# Patient Record
Sex: Female | Born: 1945 | Race: White | Hispanic: No | Marital: Married | Smoking: Never smoker
Health system: Southern US, Community
[De-identification: ages and names within clinical notes are randomized; demographics above are authoritative.]

## PROBLEM LIST (undated history)

## (undated) DIAGNOSIS — E079 Disorder of thyroid, unspecified: Secondary | ICD-10-CM

## (undated) DIAGNOSIS — C801 Malignant (primary) neoplasm, unspecified: Secondary | ICD-10-CM

## (undated) DIAGNOSIS — I10 Essential (primary) hypertension: Secondary | ICD-10-CM

## (undated) DIAGNOSIS — R748 Abnormal levels of other serum enzymes: Secondary | ICD-10-CM

## (undated) HISTORY — PX: ABDOMINAL HYSTERECTOMY: SHX81

## (undated) HISTORY — PX: APPENDECTOMY: SHX54

## (undated) HISTORY — PX: BREAST REDUCTION SURGERY: SHX8

## (undated) HISTORY — PX: TONSILLECTOMY: SUR1361

## (undated) HISTORY — PX: EYE SURGERY: SHX253

---

## 2013-11-05 ENCOUNTER — Other Ambulatory Visit: Payer: Self-pay

## 2013-11-05 ENCOUNTER — Emergency Department (HOSPITAL_COMMUNITY): Payer: Medicare Other

## 2013-11-05 ENCOUNTER — Encounter (HOSPITAL_COMMUNITY): Payer: Self-pay | Admitting: Emergency Medicine

## 2013-11-05 ENCOUNTER — Inpatient Hospital Stay (HOSPITAL_COMMUNITY)
Admission: EM | Admit: 2013-11-05 | Discharge: 2013-11-09 | DRG: 843 | Disposition: A | Discharge status: Home / Self Care | Payer: Medicare Other | Attending: Internal Medicine | Admitting: Internal Medicine

## 2013-11-05 DIAGNOSIS — N032 Chronic nephritic syndrome with diffuse membranous glomerulonephritis: Secondary | ICD-10-CM | POA: Diagnosis present

## 2013-11-05 DIAGNOSIS — I129 Hypertensive chronic kidney disease with stage 1 through stage 4 chronic kidney disease, or unspecified chronic kidney disease: Secondary | ICD-10-CM | POA: Diagnosis present

## 2013-11-05 DIAGNOSIS — N390 Urinary tract infection, site not specified: Secondary | ICD-10-CM | POA: Diagnosis present

## 2013-11-05 DIAGNOSIS — E039 Hypothyroidism, unspecified: Secondary | ICD-10-CM | POA: Diagnosis present

## 2013-11-05 DIAGNOSIS — E8809 Other disorders of plasma-protein metabolism, not elsewhere classified: Principal | ICD-10-CM | POA: Diagnosis present

## 2013-11-05 DIAGNOSIS — J90 Pleural effusion, not elsewhere classified: Secondary | ICD-10-CM | POA: Diagnosis present

## 2013-11-05 DIAGNOSIS — E8779 Other fluid overload: Secondary | ICD-10-CM | POA: Diagnosis present

## 2013-11-05 DIAGNOSIS — R0600 Dyspnea, unspecified: Secondary | ICD-10-CM

## 2013-11-05 DIAGNOSIS — R601 Generalized edema: Secondary | ICD-10-CM

## 2013-11-05 DIAGNOSIS — E669 Obesity, unspecified: Secondary | ICD-10-CM | POA: Diagnosis present

## 2013-11-05 DIAGNOSIS — J189 Pneumonia, unspecified organism: Secondary | ICD-10-CM | POA: Diagnosis present

## 2013-11-05 DIAGNOSIS — M7989 Other specified soft tissue disorders: Secondary | ICD-10-CM

## 2013-11-05 DIAGNOSIS — D649 Anemia, unspecified: Secondary | ICD-10-CM | POA: Diagnosis present

## 2013-11-05 DIAGNOSIS — R809 Proteinuria, unspecified: Secondary | ICD-10-CM | POA: Diagnosis present

## 2013-11-05 DIAGNOSIS — J449 Chronic obstructive pulmonary disease, unspecified: Secondary | ICD-10-CM | POA: Diagnosis present

## 2013-11-05 DIAGNOSIS — N182 Chronic kidney disease, stage 2 (mild): Secondary | ICD-10-CM | POA: Diagnosis present

## 2013-11-05 DIAGNOSIS — R7989 Other specified abnormal findings of blood chemistry: Secondary | ICD-10-CM

## 2013-11-05 DIAGNOSIS — J4489 Other specified chronic obstructive pulmonary disease: Secondary | ICD-10-CM | POA: Diagnosis present

## 2013-11-05 HISTORY — DX: Abnormal levels of other serum enzymes: R74.8

## 2013-11-05 HISTORY — DX: Disorder of thyroid, unspecified: E07.9

## 2013-11-05 LAB — CBC WITH DIFFERENTIAL/PLATELET
BASOS ABS: 0 10*3/uL (ref 0.0–0.1)
BASOS PCT: 0 % (ref 0–1)
EOS ABS: 0.4 10*3/uL (ref 0.0–0.7)
EOS PCT: 5 % (ref 0–5)
HEMATOCRIT: 32.3 % — AB (ref 36.0–46.0)
HEMOGLOBIN: 10.8 g/dL — AB (ref 12.0–15.0)
Lymphocytes Relative: 21 % (ref 12–46)
Lymphs Abs: 1.4 10*3/uL (ref 0.7–4.0)
MCH: 30.7 pg (ref 26.0–34.0)
MCHC: 33.4 g/dL (ref 30.0–36.0)
MCV: 91.8 fL (ref 78.0–100.0)
MONO ABS: 0.6 10*3/uL (ref 0.1–1.0)
MONOS PCT: 9 % (ref 3–12)
Neutro Abs: 4.3 10*3/uL (ref 1.7–7.7)
Neutrophils Relative %: 65 % (ref 43–77)
Platelets: 157 10*3/uL (ref 150–400)
RBC: 3.52 MIL/uL — ABNORMAL LOW (ref 3.87–5.11)
RDW: 14.5 % (ref 11.5–15.5)
WBC: 6.6 10*3/uL (ref 4.0–10.5)

## 2013-11-05 LAB — COMPREHENSIVE METABOLIC PANEL
ALBUMIN: 2.6 g/dL — AB (ref 3.5–5.2)
ALT: 15 U/L (ref 0–35)
AST: 18 U/L (ref 0–37)
Alkaline Phosphatase: 125 U/L — ABNORMAL HIGH (ref 39–117)
Anion gap: 8 (ref 5–15)
BILIRUBIN TOTAL: 0.2 mg/dL — AB (ref 0.3–1.2)
BUN: 16 mg/dL (ref 6–23)
CALCIUM: 8.5 mg/dL (ref 8.4–10.5)
CO2: 27 mEq/L (ref 19–32)
CREATININE: 0.96 mg/dL (ref 0.50–1.10)
Chloride: 108 mEq/L (ref 96–112)
GFR calc Af Amer: 70 mL/min — ABNORMAL LOW (ref >90)
GFR calc non Af Amer: 60 mL/min — ABNORMAL LOW (ref >90)
Glucose, Bld: 117 mg/dL — ABNORMAL HIGH (ref 70–99)
Potassium: 3.9 mEq/L (ref 3.7–5.3)
Sodium: 143 mEq/L (ref 137–147)
Total Protein: 6 g/dL (ref 6.0–8.3)

## 2013-11-05 LAB — URINALYSIS, ROUTINE W REFLEX MICROSCOPIC
Bilirubin Urine: NEGATIVE
Glucose, UA: NEGATIVE mg/dL
KETONES UR: NEGATIVE mg/dL
Leukocytes, UA: NEGATIVE
NITRITE: NEGATIVE
PH: 6 (ref 5.0–8.0)
Specific Gravity, Urine: >1.03 — ABNORMAL HIGH (ref 1.005–1.030)
Urobilinogen, UA: 0.2 mg/dL (ref 0.0–1.0)

## 2013-11-05 LAB — D-DIMER, QUANTITATIVE (NOT AT ARMC): D-Dimer, Quant: 1.21 ug/mL-FEU — ABNORMAL HIGH (ref 0.00–0.48)

## 2013-11-05 LAB — PRO B NATRIURETIC PEPTIDE: Pro B Natriuretic peptide (BNP): 2853 pg/mL — ABNORMAL HIGH (ref 0–125)

## 2013-11-05 LAB — URINE MICROSCOPIC-ADD ON

## 2013-11-05 MED ORDER — SODIUM CHLORIDE 0.9 % IV BOLUS (SEPSIS)
1000.0000 mL | Freq: Once | INTRAVENOUS | Status: AC
  Administered 2013-11-05: 1000 mL via INTRAVENOUS

## 2013-11-05 MED ORDER — ENOXAPARIN SODIUM 100 MG/ML ~~LOC~~ SOLN
1.0000 mg/kg | Freq: Once | SUBCUTANEOUS | Status: AC
Start: 2013-11-05 — End: 2013-11-05
  Administered 2013-11-05: 100 mg via SUBCUTANEOUS
  Filled 2013-11-05: qty 1

## 2013-11-05 MED ORDER — IOHEXOL 350 MG/ML SOLN
100.0000 mL | Freq: Once | INTRAVENOUS | Status: AC | PRN
  Administered 2013-11-05: 100 mL via INTRAVENOUS

## 2013-11-05 NOTE — ED Provider Notes (Signed)
CSN: [id number]     Arrival date & time 11/05/13  1819 History   First MD Initiated Contact with Patient 11/05/13 1844    This chart was scribed for Carmin Muskrat, MD by Terressa Koyanagi, ED Scribe. This patient was seen in room APA06/APA06 and the patient's care was started at 6:51 PM.  Chief Complaint  Patient presents with  . Leg Swelling   The history is provided by the patient and the spouse. No language interpreter was used.   HPI Comments: Caroline Terry is a 68 y.o. female who presents to the Emergency Department complaining of leg swelling to the left leg from the left knee to the foot onset yesterday. Pt also complains of associated pain and warmth in said area. Pt notes that she is in the midst of a road trip via car; and she traveled from [place] to [place] yesterday.   Pt also reports that she was treated at [facility] yesterday for pneumonia and Dx with PNA and started on antibiotics. Pt reports a CT was done of the chest and blood test at [facility].   Pt notes she has had a cough, however, she denies chest pain and SOB. Pt and her husband report she is otherwise healthy.   Pt reports taking the following meds regularly: Levofloxacin 500 Mg; Carvedilol 3.125 Mg; Lisinopril; Levothyroxin 125 Mg.    Past Medical History  Diagnosis Date  . Thyroid disease     hypothyroidism  . Cardiac enzymes elevated    Past Surgical History  Procedure Laterality Date  . Breast reduction surgery    . Eye surgery    . Tonsillectomy     History reviewed. No pertinent family history. History  Substance Use Topics  . Smoking status: Never Smoker   . Smokeless tobacco: Not on file  . Alcohol Use: No   OB History   Grav Para Term Preterm Abortions TAB SAB Ect Mult Living                 Review of Systems  Constitutional: Negative for fever.       Per HPI, otherwise negative  HENT:       Per HPI, otherwise negative  Respiratory: Positive for cough. Negative for shortness of  breath.        Per HPI, otherwise negative  Cardiovascular: Positive for leg swelling (left leg ). Negative for chest pain.       Per HPI, otherwise negative  Gastrointestinal: Negative for vomiting.  Endocrine:       Negative aside from HPI  Genitourinary:       Neg aside from HPI   Musculoskeletal:       Left leg tenderness Per HPI, otherwise negative  Skin: Negative.   Neurological: Negative for syncope.    Allergies  Review of patient's allergies indicates no known allergies.  Home Medications   Prior to Admission medications   Not on File   Triage Notes: BP 165/53  Pulse 58  Temp(Src) 98.3 F (36.8 C) (Oral)  Resp 18  Ht 5\' 10"  (1.778 m)  Wt 219 lb (99.338 kg)  BMI 31.42 kg/m2  SpO2 99% Physical Exam  Nursing note and vitals reviewed. Constitutional: She is oriented to person, place, and time. She appears well-developed and well-nourished. No distress.  HENT:  Head: Normocephalic and atraumatic.  Eyes: Conjunctivae and EOM are normal.  Cardiovascular: Normal rate and regular rhythm.   Pulmonary/Chest: Effort normal and breath sounds normal. No stridor. No respiratory distress.  Abdominal: She exhibits no distension. There is no tenderness.  Musculoskeletal: Normal range of motion. She exhibits edema (left calf ).  Neurological: She is alert and oriented to person, place, and time. No cranial nerve deficit.  Skin: Skin is warm and dry.  Psychiatric: She has a normal mood and affect.    ED Course  Procedures (including critical care time) 6:58 PM-Discussed treatment plan which includes labs and imaging with pt at bedside and pt agreed to plan.  7:59PM: Discussed evidence of blood clots and pneumonia with pt and pt's spouse. Discussed treatment plan which includes CT of chest and hospital admittance with pt and pt's husband at bedside. Patient's husband and pt verbalizes understanding and agrees with treatment plan.  Labs Review Labs Reviewed  CBC WITH  DIFFERENTIAL - Abnormal; Notable for the following:    RBC 3.52 (*)    Hemoglobin 10.8 (*)    HCT 32.3 (*)    All other components within normal limits  COMPREHENSIVE METABOLIC PANEL - Abnormal; Notable for the following:    Glucose, Bld 117 (*)    Albumin 2.6 (*)    Alkaline Phosphatase 125 (*)    Total Bilirubin 0.2 (*)    GFR calc non Af Amer 60 (*)    GFR calc Af Amer 70 (*)    All other components within normal limits  URINALYSIS, ROUTINE W REFLEX MICROSCOPIC - Abnormal; Notable for the following:    APPearance HAZY (*)    Specific Gravity, Urine >1.030 (*)    Hgb urine dipstick MODERATE (*)    Protein, ur >300 (*)    All other components within normal limits  D-DIMER, QUANTITATIVE - Abnormal; Notable for the following:    D-Dimer, Quant 1.21 (*)    All other components within normal limits  PRO B NATRIURETIC PEPTIDE - Abnormal; Notable for the following:    Pro B Natriuretic peptide (BNP) 2853.0 (*)    All other components within normal limits  URINE MICROSCOPIC-ADD ON - Abnormal; Notable for the following:    Bacteria, UA MANY (*)    All other components within normal limits    Imaging Review Dg Chest 2 View  11/05/2013   CLINICAL DATA:  Newly diagnosed pneumonia.  EXAM: CHEST  2 VIEW  COMPARISON:  None.  FINDINGS: Cardiomegaly with small to moderate left pleural effusion. Associated left lower lobe opacity, likely atelectasis. No frank interstitial edema. No pneumothorax.  IMPRESSION: Cardiomegaly with small to moderate left pleural effusion.  Associated left lower lobe opacity, likely atelectasis.   Electronically Signed   By: Julian Hy M.D.   On: 11/05/2013 19:49   Ct Angio Chest Pe W/cm &/or Wo Cm  11/05/2013   CLINICAL DATA:  Left leg swelling, newly diagnosed pneumonia, elevated D-dimer  EXAM: CT ANGIOGRAPHY CHEST WITH CONTRAST  TECHNIQUE: Multidetector CT imaging of the chest was performed using the standard protocol during bolus administration of intravenous  contrast. Multiplanar CT image reconstructions and MIPs were obtained to evaluate the vascular anatomy.  CONTRAST:  169mL OMNIPAQUE IOHEXOL 350 MG/ML SOLN  COMPARISON:  Chest radiograph dated 11/05/2013  FINDINGS: No evidence of pulmonary embolism.  Evaluation of lung parenchyma is constrained by respiratory motion. Mild patchy ground-glass opacity in the lateral right upper lobe is favored to reflect mild interstitial edema (series 11/image 17). Additional mild interstitial edema with scattered interlobular septal thickening. Mild lower lobe compressive atelectasis. Moderate bilateral pleural effusions. No pneumothorax.  Cardiomegaly.  No pericardial effusion.  Visualized thyroid is mildly  enlarged/heterogeneous but grossly unremarkable.  Mildly prominent mediastinal lymph nodes which are likely reactive.  Visualized upper abdomen is grossly unremarkable.  Mild degenerative changes of the thoracic spine.  Review of the MIP images confirms the above findings.  IMPRESSION: No evidence of pulmonary embolism.  Cardiomegaly with mild interstitial edema and moderate bilateral pleural effusions.   Electronically Signed   By: Julian Hy M.D.   On: 11/05/2013 21:20    EKG sinus rhythm, rate 62, low voltage, otherwise unremarkable.  On repeat exam the patient continues to have mild dyspnea, leg swelling. With positive d-dimer, CT scan will be performed.   CT scan is not notable for PE, but there is cardiomegaly, and given bilateral pleural effusions, elevated BNP, she will be admitted.   MDM  This patient presents with ongoing cough, dyspnea, lower extremity edema asymmetrically. Patient's evaluation here is notable for elevated d-dimer, elevated BNP. Patient has no diagnosis of heart failure. Given patient's history of recent travel, or asymmetrical extremity edema, lower extremity ultrasound isn't indicated, but not currently available. Given the elevated BNP, ongoing dyspnea, new pleural effusions,  she will be admitted for further evaluation, management, consideration of echo if warranted. She is already taking Levofloxacin.   I personally performed the services described in this documentation, which was scribed in my presence. The recorded information has been reviewed and is accurate.      Carmin Muskrat, MD 11/05/13 2203

## 2013-11-05 NOTE — ED Notes (Signed)
Patient recently rode in car from [place] and noticed yesterday that from L knee to foot is edematous, warm and tender to touch.  Denies CP, but was diagnosed w/PNA yesterday at [facility] and placed on antibx.

## 2013-11-06 ENCOUNTER — Inpatient Hospital Stay (HOSPITAL_COMMUNITY): Payer: Medicare Other

## 2013-11-06 DIAGNOSIS — J90 Pleural effusion, not elsewhere classified: Secondary | ICD-10-CM

## 2013-11-06 DIAGNOSIS — R799 Abnormal finding of blood chemistry, unspecified: Secondary | ICD-10-CM

## 2013-11-06 DIAGNOSIS — R809 Proteinuria, unspecified: Secondary | ICD-10-CM

## 2013-11-06 DIAGNOSIS — I369 Nonrheumatic tricuspid valve disorder, unspecified: Secondary | ICD-10-CM

## 2013-11-06 DIAGNOSIS — R0609 Other forms of dyspnea: Secondary | ICD-10-CM

## 2013-11-06 DIAGNOSIS — R0989 Other specified symptoms and signs involving the circulatory and respiratory systems: Secondary | ICD-10-CM

## 2013-11-06 DIAGNOSIS — M7989 Other specified soft tissue disorders: Secondary | ICD-10-CM | POA: Diagnosis present

## 2013-11-06 LAB — COMPREHENSIVE METABOLIC PANEL
ALBUMIN: 2.5 g/dL — AB (ref 3.5–5.2)
ALK PHOS: 120 U/L — AB (ref 39–117)
ALT: 11 U/L (ref 0–35)
ANION GAP: 8 (ref 5–15)
AST: 15 U/L (ref 0–37)
BILIRUBIN TOTAL: 0.3 mg/dL (ref 0.3–1.2)
BUN: 15 mg/dL (ref 6–23)
CHLORIDE: 112 meq/L (ref 96–112)
CO2: 26 mEq/L (ref 19–32)
CREATININE: 0.97 mg/dL (ref 0.50–1.10)
Calcium: 8.4 mg/dL (ref 8.4–10.5)
GFR calc Af Amer: 69 mL/min — ABNORMAL LOW (ref >90)
GFR calc non Af Amer: 60 mL/min — ABNORMAL LOW (ref >90)
Glucose, Bld: 102 mg/dL — ABNORMAL HIGH (ref 70–99)
Potassium: 4.3 mEq/L (ref 3.7–5.3)
Sodium: 146 mEq/L (ref 137–147)
TOTAL PROTEIN: 5.7 g/dL — AB (ref 6.0–8.3)

## 2013-11-06 LAB — CBC
HCT: 31.8 % — ABNORMAL LOW (ref 36.0–46.0)
HEMOGLOBIN: 10.7 g/dL — AB (ref 12.0–15.0)
MCH: 30.8 pg (ref 26.0–34.0)
MCHC: 33.6 g/dL (ref 30.0–36.0)
MCV: 91.6 fL (ref 78.0–100.0)
Platelets: 151 10*3/uL (ref 150–400)
RBC: 3.47 MIL/uL — ABNORMAL LOW (ref 3.87–5.11)
RDW: 14.6 % (ref 11.5–15.5)
WBC: 7.9 10*3/uL (ref 4.0–10.5)

## 2013-11-06 MED ORDER — SODIUM CHLORIDE 0.9 % IJ SOLN: 3.0000 mL | INTRAMUSCULAR | Status: DC | PRN

## 2013-11-06 MED ORDER — ALBUTEROL (5 MG/ML) CONTINUOUS INHALATION SOLN
3.0000 mL | INHALATION_SOLUTION | RESPIRATORY_TRACT | Status: DC | PRN
Start: 2013-11-06 — End: 2013-11-07

## 2013-11-06 MED ORDER — CARVEDILOL 3.125 MG PO TABS
3.1250 mg | ORAL_TABLET | Freq: Two times a day (BID) | ORAL | Status: DC
  Administered 2013-11-06 – 2013-11-09 (×7): 3.125 mg via ORAL
  Filled 2013-11-06 (×7): qty 1

## 2013-11-06 MED ORDER — LEVOFLOXACIN 500 MG PO TABS
500.0000 mg | ORAL_TABLET | Freq: Every day | ORAL | Status: DC
  Administered 2013-11-06 – 2013-11-09 (×4): 500 mg via ORAL
  Filled 2013-11-06 (×4): qty 1

## 2013-11-06 MED ORDER — SODIUM CHLORIDE 0.9 % IJ SOLN
3.0000 mL | Freq: Two times a day (BID) | INTRAMUSCULAR | Status: DC
  Administered 2013-11-07 – 2013-11-08 (×3): 3 mL via INTRAVENOUS

## 2013-11-06 MED ORDER — ACETAMINOPHEN 325 MG PO TABS
650.0000 mg | ORAL_TABLET | Freq: Four times a day (QID) | ORAL | Status: DC | PRN
  Administered 2013-11-06: 650 mg via ORAL
  Filled 2013-11-06: qty 2

## 2013-11-06 MED ORDER — LEVOTHYROXINE SODIUM 25 MCG PO TABS
125.0000 ug | ORAL_TABLET | Freq: Every day | ORAL | Status: DC
  Administered 2013-11-06 – 2013-11-09 (×4): 125 ug via ORAL
  Filled 2013-11-06 (×8): qty 1

## 2013-11-06 MED ORDER — FUROSEMIDE 10 MG/ML IJ SOLN
20.0000 mg | Freq: Two times a day (BID) | INTRAMUSCULAR | Status: DC
  Administered 2013-11-06 – 2013-11-08 (×5): 20 mg via INTRAVENOUS
  Filled 2013-11-06 (×5): qty 2

## 2013-11-06 MED ORDER — LISINOPRIL 5 MG PO TABS
5.0000 mg | ORAL_TABLET | Freq: Every day | ORAL | Status: DC
  Administered 2013-11-06 – 2013-11-09 (×4): 5 mg via ORAL
  Filled 2013-11-06 (×4): qty 1

## 2013-11-06 MED ORDER — SODIUM CHLORIDE 0.9 % IJ SOLN
3.0000 mL | Freq: Two times a day (BID) | INTRAMUSCULAR | Status: DC
  Administered 2013-11-06 – 2013-11-07 (×3): 3 mL via INTRAVENOUS
  Administered 2013-11-08: 21:00:00 via INTRAVENOUS
  Administered 2013-11-09: 3 mL via INTRAVENOUS

## 2013-11-06 MED ORDER — SODIUM CHLORIDE 0.9 % IV SOLN: 250.0000 mL | INTRAVENOUS | Status: DC | PRN

## 2013-11-06 MED ORDER — ENOXAPARIN SODIUM 40 MG/0.4ML ~~LOC~~ SOLN
40.0000 mg | SUBCUTANEOUS | Status: DC
  Administered 2013-11-06 – 2013-11-08 (×3): 40 mg via SUBCUTANEOUS
  Filled 2013-11-06 (×3): qty 0.4

## 2013-11-06 MED ORDER — ACETAMINOPHEN 650 MG RE SUPP: 650.0000 mg | Freq: Four times a day (QID) | RECTAL | Status: DC | PRN

## 2013-11-06 MED ORDER — ASPIRIN 81 MG PO CHEW
81.0000 mg | CHEWABLE_TABLET | Freq: Every day | ORAL | Status: DC
  Administered 2013-11-06 – 2013-11-09 (×4): 81 mg via ORAL
  Filled 2013-11-06 (×4): qty 1

## 2013-11-06 NOTE — Progress Notes (Signed)
Patient admitted to the hospital by Dr. Darrick Meigs earlier this morning  Patient seen and examined.  She has been admitted with lower extremity edema in fact have bilateral pleural effusions. Albumin was noted to be low at 2.6. She was noted to have significant proteinuria. It is possible that she has nephrotic syndrome resulting in anasarca. We'll check 24-hour urine for protein. Lower extremity Dopplers negative for DVT. Echocardiogram shows preserved ejection fraction and grade 1 diastolic dysfunction. We'll start the patient on low-dose Lasix monitor intake and output. Patient also has urinary tract infection on antibiotics  MEMON,JEHANZEB

## 2013-11-06 NOTE — H&P (Signed)
PCP:   No PCP Per Patient   Chief Complaint:  Leg swelling  HPI: 68 year old female with history of hypertension, hypothyroidism who came to the ED complaining of leg swelling of the left leg since yesterday. Patient also complains of pain in the left leg. Patient recently moved to [place] from [place] via a car in a road trip. Patient just got married on 16th of last month. Then yesterday patient developed leg swelling her husband got concerned [facility] diagnosed with pneumonia and started on antibiotics. Patient denies shortness of breath has been coughing, denies chest pain. In the ED patient was found to have elevated BNP 2853, d-dimer was elevated to 1.21, CT angiogram of the chest was done which was negative for pulmonary embolism. It showed interstitial edema with bilateral pleural effusions. Patient also has abnormal UA with 21-50 WBCs per high-power field. Ultrasound of the lower extremities not available at this time, patient given empiric dose of Lovenox therapeutic anticoagulation.  Allergies:  No Known Allergies    Past Medical History  Diagnosis Date  . Thyroid disease     hypothyroidism  . Cardiac enzymes elevated     Past Surgical History  Procedure Laterality Date  . Breast reduction surgery    . Eye surgery    . Tonsillectomy      Prior to Admission medications   Medication Sig Start Date End Date Taking? Authorizing Provider  albuterol (PROVENTIL HFA) 108 (90 BASE) MCG/ACT inhaler Inhale 2 puffs into the lungs every 4 (four) hours as needed for wheezing or shortness of breath.   Yes Historical Provider, MD  aspirin 81 MG chewable tablet Chew 81 mg by mouth daily.   Yes Historical Provider, MD  carvedilol (COREG) 3.125 MG tablet Take 3.125 mg by mouth 2 (two) times daily with a meal.   Yes Historical Provider, MD  levofloxacin (LEVAQUIN) 500 MG tablet Take 500 mg by mouth daily. For 10 days started 11/04/13   Yes Historical  Provider, MD  levothyroxine (SYNTHROID, LEVOTHROID) 125 MCG tablet Take 125 mcg by mouth daily before breakfast.   Yes Historical Provider, MD  lisinopril (PRINIVIL,ZESTRIL) 5 MG tablet Take 5 mg by mouth daily.   Yes Historical Provider, MD    Social History:  reports that she has never smoked. She does not have any smokeless tobacco history on file. She reports that she does not drink alcohol or use illicit drugs.    All the positives are listed in BOLD  Review of Systems:  HEENT: Headache, blurred vision, runny nose, sore throat Neck: Hypothyroidism, hyperthyroidism,,lymphadenopathy Chest : Shortness of breath, history of COPD, Asthma Heart : Chest pain, history of coronary arterey disease GI:  Nausea, vomiting, diarrhea, constipation, GERD GU: Dysuria, urgency, frequency of urination, hematuria Neuro: Stroke, seizures, syncope Psych: Depression, anxiety, hallucinations   Physical Exam: Blood pressure 163/75, pulse 62, temperature 97.8 F (36.6 C), temperature source Oral, resp. rate 20, height 5\' 10"  (1.778 m), weight 97.07 kg (214 lb), SpO2 98.00%. Constitutional:   Patient is a well-developed and well-nourished female* in no acute distress and cooperative with exam. Head: Normocephalic and atraumatic Mouth: Mucus membranes moist Eyes: PERRL, EOMI, conjunctivae normal Neck: Supple, No Thyromegaly Cardiovascular: RRR, S1 normal, S2 normal Pulmonary/Chest: Bibasilar crackles Abdominal: Soft. Non-tender, non-distended, bowel sounds are normal, no masses, organomegaly, or guarding present.  Neurological: A&O x3, Strenght is normal and symmetric bilaterally, cranial nerve II-XII are grossly intact, no focal motor deficit, sensory intact  to light touch bilaterally.  Extremities : Trace edema noted in the left lower extremity, positive calf tenderness on palpation  Labs on Admission:  Basic Metabolic Panel:  Recent Labs Lab 11/05/13 1909  NA 143  K 3.9  CL 108  CO2 27    GLUCOSE 117*  BUN 16  CREATININE 0.96  CALCIUM 8.5   Liver Function Tests:  Recent Labs Lab 11/05/13 1909  AST 18  ALT 15  ALKPHOS 125*  BILITOT 0.2*  PROT 6.0  ALBUMIN 2.6*   No results found for this basename: LIPASE, AMYLASE,  in the last 168 hours No results found for this basename: AMMONIA,  in the last 168 hours CBC:  Recent Labs Lab 11/05/13 1909  WBC 6.6  NEUTROABS 4.3  HGB 10.8*  HCT 32.3*  MCV 91.8  PLT 157   Cardiac Enzymes: No results found for this basename: CKTOTAL, CKMB, CKMBINDEX, TROPONINI,  in the last 168 hours  BNP (last 3 results)  Recent Labs  11/05/13 1909  PROBNP 2853.0*   CBG: No results found for this basename: GLUCAP,  in the last 168 hours  Radiological Exams on Admission: Dg Chest 2 View  11/05/2013   CLINICAL DATA:  Newly diagnosed pneumonia.  EXAM: CHEST  2 VIEW  COMPARISON:  None.  FINDINGS: Cardiomegaly with small to moderate left pleural effusion. Associated left lower lobe opacity, likely atelectasis. No frank interstitial edema. No pneumothorax.  IMPRESSION: Cardiomegaly with small to moderate left pleural effusion.  Associated left lower lobe opacity, likely atelectasis.   Electronically Signed   By: Julian Hy M.D.   On: 11/05/2013 19:49   Ct Angio Chest Pe W/cm &/or Wo Cm  11/05/2013   CLINICAL DATA:  Left leg swelling, newly diagnosed pneumonia, elevated D-dimer  EXAM: CT ANGIOGRAPHY CHEST WITH CONTRAST  TECHNIQUE: Multidetector CT imaging of the chest was performed using the standard protocol during bolus administration of intravenous contrast. Multiplanar CT image reconstructions and MIPs were obtained to evaluate the vascular anatomy.  CONTRAST:  163mL OMNIPAQUE IOHEXOL 350 MG/ML SOLN  COMPARISON:  Chest radiograph dated 11/05/2013  FINDINGS: No evidence of pulmonary embolism.  Evaluation of lung parenchyma is constrained by respiratory motion. Mild patchy ground-glass opacity in the lateral right upper lobe is favored  to reflect mild interstitial edema (series 11/image 17). Additional mild interstitial edema with scattered interlobular septal thickening. Mild lower lobe compressive atelectasis. Moderate bilateral pleural effusions. No pneumothorax.  Cardiomegaly.  No pericardial effusion.  Visualized thyroid is mildly enlarged/heterogeneous but grossly unremarkable.  Mildly prominent mediastinal lymph nodes which are likely reactive.  Visualized upper abdomen is grossly unremarkable.  Mild degenerative changes of the thoracic spine.  Review of the MIP images confirms the above findings.  IMPRESSION: No evidence of pulmonary embolism.  Cardiomegaly with mild interstitial edema and moderate bilateral pleural effusions.   Electronically Signed   By: Julian Hy M.D.   On: 11/05/2013 21:20    EKG: Independently reviewed. Sinus rhythm with T wave inversions in the inferior leads   Assessment/Plan Active Problems:   Pleural effusion, bilateral   Leg swelling   Hypertension   Leg swelling Patient will be admitted, will obtain ultrasound of the lower extremities in a.m. to rule out DVT. Patient also has elevated BNP, will obtain 2-D echocardiogram to rule out underlying CHF contributing to patient's leg swelling. If ultrasound is negative for DVT consider starting Lasix in a.m. patient's husband refused to get Lasix at this time, wants to wait to check  the results of ultrasound.. Empiric dose of Lovenox therapeutic has been given  Hypertension Continue Coreg, lisinopril. BP is stable at this time  ? Pneumonia [facility], will continue Levaquin 500 mg by mouth daily.  Hypothyroidism Continue Synthroid   Code status: Patient is full code  Family discussion: Discussed with patient's husband at bedside   Time Spent on Admission: 56 minutes  [facility] Pager: [phone] 11/06/2013, 12:41 AM  If 7PM-7AM, please contact night-coverage    [email or web address]  Password [email or web address]

## 2013-11-06 NOTE — Progress Notes (Signed)
  Echocardiogram 2D Echocardiogram has been performed.  Darlina Sicilian M 11/06/2013, 8:30 AM

## 2013-11-07 ENCOUNTER — Inpatient Hospital Stay (HOSPITAL_COMMUNITY): Payer: Medicare Other

## 2013-11-07 DIAGNOSIS — E039 Hypothyroidism, unspecified: Secondary | ICD-10-CM

## 2013-11-07 DIAGNOSIS — R609 Edema, unspecified: Secondary | ICD-10-CM

## 2013-11-07 LAB — BASIC METABOLIC PANEL
ANION GAP: 10 (ref 5–15)
BUN: 18 mg/dL (ref 6–23)
CHLORIDE: 110 meq/L (ref 96–112)
CO2: 26 meq/L (ref 19–32)
Calcium: 8.5 mg/dL (ref 8.4–10.5)
Creatinine, Ser: 1.08 mg/dL (ref 0.50–1.10)
GFR calc non Af Amer: 52 mL/min — ABNORMAL LOW (ref >90)
GFR, EST AFRICAN AMERICAN: 61 mL/min — AB (ref >90)
Glucose, Bld: 89 mg/dL (ref 70–99)
POTASSIUM: 3.8 meq/L (ref 3.7–5.3)
Sodium: 146 mEq/L (ref 137–147)

## 2013-11-07 LAB — HEPATITIS PANEL, ACUTE
HCV Ab: NEGATIVE
Hep A IgM: NONREACTIVE
Hep B C IgM: NONREACTIVE
Hepatitis B Surface Ag: NEGATIVE

## 2013-11-07 LAB — PROTEIN, URINE, 24 HOUR
Collection Interval-UPROT: 24 hours
PROTEIN 24H UR: 1794 mg/d — AB (ref 50–100)
Protein, Urine: 69 mg/dL
Urine Total Volume-UPROT: 2600 mL

## 2013-11-07 LAB — CREATININE, URINE, 24 HOUR
CREATININE 24H UR: 885 mg/d (ref 700–1800)
CREATININE, URINE: 34.02 mg/dL
Collection Interval-UCRE24: 24 hours
Urine Total Volume-UCRE24: 2600 mL

## 2013-11-07 MED ORDER — BIOTENE DRY MOUTH MT LIQD
15.0000 mL | Freq: Two times a day (BID) | OROMUCOSAL | Status: DC
  Administered 2013-11-07 – 2013-11-09 (×4): 15 mL via OROMUCOSAL

## 2013-11-07 MED ORDER — ALBUTEROL SULFATE (2.5 MG/3ML) 0.083% IN NEBU: 2.5000 mg | INHALATION_SOLUTION | RESPIRATORY_TRACT | Status: DC | PRN

## 2013-11-07 NOTE — Progress Notes (Signed)
[facility] PROGRESS NOTE  Jullianna Gabor JEH:[id number] DOB: [birth year] DOA: 11/05/2013 PCP: No PCP Per Patient  Assessment/Plan: 1. Bilateral pleural effusions with anasarca. Appears to be related to hypoalbuminemia. Patient has been started on intravenous Lasix with good diuresis. She still has evidence of volume overload. Continue current treatments 2. Hypoalbuminemia. Possibly related to poor diet over the last several weeks. She does have proteinuria on urinalysis. 24-hour urine protein is in process to check for nephrotic range proteinuria 3. Hypertension. Continue Coreg and lisinopril 4. Urinary tract infection. Continue the patient on Levaquin. Unfortunately urine culture was not sent. We'll treat empirically for a total of 5 days. 5. Hypothyroidism. Continue Synthroid  Code Status: full code Family Communication: no family present Disposition Plan: discharge home once improved   Consultants:    Procedures:    Antibiotics:    HPI/Subjective: Feeling better, shortness of breath and edema improving  Objective: Filed Vitals:   11/07/13 0651  BP: 141/56  Pulse:   Temp: 99.2 F (37.3 C)  Resp: 20    Intake/Output Summary (Last 24 hours) at 11/07/13 1041 Last data filed at 11/07/13 1010  Gross per 24 hour  Intake    480 ml  Output   3825 ml  Net  -3345 ml   Filed Weights   11/05/13 1837 11/06/13 0018 11/07/13 0651  Weight: 99.338 kg (219 lb) 97.07 kg (214 lb) 96.163 kg (212 lb)    Exam:   General:  NAD  Cardiovascular: S1, S2 RRR  Respiratory: diminished breat sounds at bases  Abdomen: soft, nt, nd, bs+  Musculoskeletal: 1+ edema b/l   Data Reviewed: Basic Metabolic Panel:  Recent Labs Lab 11/05/13 1909 11/06/13 0541 11/07/13 0607  NA 143 146 146  K 3.9 4.3 3.8  CL 108 112 110  CO2 27 26 26   GLUCOSE 117* 102* 89  BUN 16 15 18   CREATININE 0.96 0.97 1.08  CALCIUM 8.5 8.4 8.5   Liver Function Tests:  Recent Labs Lab  11/05/13 1909 11/06/13 0541  AST 18 15  ALT 15 11  ALKPHOS 125* 120*  BILITOT 0.2* 0.3  PROT 6.0 5.7*  ALBUMIN 2.6* 2.5*   No results found for this basename: LIPASE, AMYLASE,  in the last 168 hours No results found for this basename: AMMONIA,  in the last 168 hours CBC:  Recent Labs Lab 11/05/13 1909 11/06/13 0541  WBC 6.6 7.9  NEUTROABS 4.3  --   HGB 10.8* 10.7*  HCT 32.3* 31.8*  MCV 91.8 91.6  PLT 157 151   Cardiac Enzymes: No results found for this basename: CKTOTAL, CKMB, CKMBINDEX, TROPONINI,  in the last 168 hours BNP (last 3 results)  Recent Labs  11/05/13 1909  PROBNP 2853.0*   CBG: No results found for this basename: GLUCAP,  in the last 168 hours  No results found for this or any previous visit (from the past 240 hour(s)).   Studies: Dg Chest 2 View  11/05/2013   CLINICAL DATA:  Newly diagnosed pneumonia.  EXAM: CHEST  2 VIEW  COMPARISON:  None.  FINDINGS: Cardiomegaly with small to moderate left pleural effusion. Associated left lower lobe opacity, likely atelectasis. No frank interstitial edema. No pneumothorax.  IMPRESSION: Cardiomegaly with small to moderate left pleural effusion.  Associated left lower lobe opacity, likely atelectasis.   Electronically Signed   By: Julian Hy M.D.   On: 11/05/2013 19:49   Ct Angio Chest Pe W/cm &/or Wo Cm  11/05/2013   CLINICAL DATA:  Left  leg swelling, newly diagnosed pneumonia, elevated D-dimer  EXAM: CT ANGIOGRAPHY CHEST WITH CONTRAST  TECHNIQUE: Multidetector CT imaging of the chest was performed using the standard protocol during bolus administration of intravenous contrast. Multiplanar CT image reconstructions and MIPs were obtained to evaluate the vascular anatomy.  CONTRAST:  138mL OMNIPAQUE IOHEXOL 350 MG/ML SOLN  COMPARISON:  Chest radiograph dated 11/05/2013  FINDINGS: No evidence of pulmonary embolism.  Evaluation of lung parenchyma is constrained by respiratory motion. Mild patchy ground-glass opacity in  the lateral right upper lobe is favored to reflect mild interstitial edema (series 11/image 17). Additional mild interstitial edema with scattered interlobular septal thickening. Mild lower lobe compressive atelectasis. Moderate bilateral pleural effusions. No pneumothorax.  Cardiomegaly.  No pericardial effusion.  Visualized thyroid is mildly enlarged/heterogeneous but grossly unremarkable.  Mildly prominent mediastinal lymph nodes which are likely reactive.  Visualized upper abdomen is grossly unremarkable.  Mild degenerative changes of the thoracic spine.  Review of the MIP images confirms the above findings.  IMPRESSION: No evidence of pulmonary embolism.  Cardiomegaly with mild interstitial edema and moderate bilateral pleural effusions.   Electronically Signed   By: Julian Hy M.D.   On: 11/05/2013 21:20   US Venous Img Lower Bilateral  11/06/2013   CLINICAL DATA:  Bilateral leg swelling and pain.  EXAM: BILATERAL LOWER EXTREMITY VENOUS DOPPLER ULTRASOUND  TECHNIQUE: Gray-scale sonography with graded compression, as well as color Doppler and duplex ultrasound were performed to evaluate the lower extremity deep venous systems from the level of the common femoral vein and including the common femoral, femoral, profunda femoral, popliteal and calf veins including the posterior tibial, peroneal and gastrocnemius veins when visible. The superficial great saphenous vein was also interrogated. Spectral Doppler was utilized to evaluate flow at rest and with distal augmentation maneuvers in the common femoral, femoral and popliteal veins.  COMPARISON:  None.  FINDINGS: RIGHT LOWER EXTREMITY  Common Femoral Vein: No evidence of thrombus. Normal compressibility, respiratory phasicity and response to augmentation.  Saphenofemoral Junction: No evidence of thrombus. Normal compressibility and flow on color Doppler imaging.  Profunda Femoral Vein: No evidence of thrombus. Normal compressibility and flow on color  Doppler imaging.  Femoral Vein: No evidence of thrombus. Normal compressibility, respiratory phasicity and response to augmentation.  Popliteal Vein: No evidence of thrombus. Normal compressibility, respiratory phasicity and response to augmentation.  Calf Veins: Not evaluated.  Superficial Great Saphenous Vein: No evidence of thrombus. Normal compressibility and flow on color Doppler imaging.  Venous Reflux:  None.  Other Findings:  None.  LEFT LOWER EXTREMITY  Common Femoral Vein: No evidence of thrombus. Normal compressibility, respiratory phasicity and response to augmentation.  Saphenofemoral Junction: No evidence of thrombus. Normal compressibility and flow on color Doppler imaging.  Profunda Femoral Vein: No evidence of thrombus. Normal compressibility and flow on color Doppler imaging.  Femoral Vein: No evidence of thrombus. Normal compressibility, respiratory phasicity and response to augmentation.  Popliteal Vein: No evidence of thrombus. Normal compressibility, respiratory phasicity and response to augmentation.  Calf Veins: Not evaluated.  Superficial Great Saphenous Vein: No evidence of thrombus. Normal compressibility and flow on color Doppler imaging.  Venous Reflux:  None.  Other Findings:  None.  IMPRESSION: No evidence of deep venous thrombosis.   Electronically Signed   By: Enrique Sack M.D.   On: 11/06/2013 10:48    Scheduled Meds: . aspirin  81 mg Oral Daily  . carvedilol  3.125 mg Oral BID WC  . enoxaparin (LOVENOX) injection  40 mg Subcutaneous Q24H  . furosemide  20 mg Intravenous BID  . levofloxacin  500 mg Oral Daily  . levothyroxine  125 mcg Oral QAC breakfast  . lisinopril  5 mg Oral Daily  . sodium chloride  3 mL Intravenous Q12H  . sodium chloride  3 mL Intravenous Q12H   Continuous Infusions:   Active Problems:   Pleural effusion, bilateral   Leg swelling    Time spent: 65mins    MEMON,JEHANZEB  [facility] Pager [phone]. If 7PM-7AM, please contact  night-coverage at [email or web address], password [facility] 11/07/2013, 10:41 AM  LOS: 2 days

## 2013-11-07 NOTE — Consult Note (Signed)
Caroline Terry MRN: [id number] DOB/AGE: 1946-03-27 68 y.o. Primary Care Physician:No PCP Per Patient Admit date: 11/05/2013 Chief Complaint:  Chief Complaint  Patient presents with  . Leg Swelling   HPI: pt is 68 year old female with past medical hx of HTN who presented to er with c/o Le swelling.  HPI dates back to few weeks back when pt started developing Le edema, it was progressive and pt came to ER. Pt was evaluated in ER and admitted for possibler DFVT. Later  DVT was ruled out. Pt was found to have low albumin and high urine protein and nephrology was consulted NO c/o chest pain No c/o hematuria NO c/o fevr/cough chills NO c/o sore throat. NO hx of recent NSAIDS use. Pt does gives hx of snoring . Pt also says she falls sleep while watching TV and on passenger side of the car.   Past Medical History  Diagnosis Date  . Thyroid disease     hypothyroidism  . Cardiac enzymes elevated         History reviewed. No pertinent family history.  Social History:  reports that she has never smoked. She does not have any smokeless tobacco history on file. She reports that she does not drink alcohol or use illicit drugs.   Allergies: No Known Allergies  Medications Prior to Admission  Medication Sig Dispense Refill  . albuterol (PROVENTIL HFA) 108 (90 BASE) MCG/ACT inhaler Inhale 2 puffs into the lungs every 4 (four) hours as needed for wheezing or shortness of breath.      Marland Kitchen aspirin 81 MG chewable tablet Chew 81 mg by mouth daily.      . carvedilol (COREG) 3.125 MG tablet Take 3.125 mg by mouth 2 (two) times daily with a meal.      . levofloxacin (LEVAQUIN) 500 MG tablet Take 500 mg by mouth daily. For 10 days started 11/04/13      . levothyroxine (SYNTHROID, LEVOTHROID) 125 MCG tablet Take 125 mcg by mouth daily before breakfast.      . lisinopril (PRINIVIL,ZESTRIL) 5 MG tablet Take 5 mg by mouth daily.           XNT:ZGYFV from the symptoms mentioned above,there are no  other symptoms referable to all systems reviewed.  Marland Kitchen antiseptic oral rinse  15 mL Mouth Rinse BID  . aspirin  81 mg Oral Daily  . carvedilol  3.125 mg Oral BID WC  . enoxaparin (LOVENOX) injection  40 mg Subcutaneous Q24H  . furosemide  20 mg Intravenous BID  . levofloxacin  500 mg Oral Daily  . levothyroxine  125 mcg Oral QAC breakfast  . lisinopril  5 mg Oral Daily  . sodium chloride  3 mL Intravenous Q12H  . sodium chloride  3 mL Intravenous Q12H       Physical Exam: Vital signs in last 24 hours: Temp:  [97.7 F (36.5 C)-99.2 F (37.3 C)] 99.2 F (37.3 C) (07/05 0651) Pulse Rate:  [56-65] 65 (07/04 2153) Resp:  [18-20] 20 (07/05 0651) BP: (141-154)/(56-72) 141/56 mmHg (07/05 0651) SpO2:  [96 %-98 %] 96 % (07/05 0651) Weight:  [212 lb (96.163 kg)] 212 lb (96.163 kg) (07/05 0651) Weight change: -7 lb (-3.175 kg) Last BM Date: 11/05/13  Intake/Output from previous day: 07/04 0701 - 07/05 0700 In: 480 [P.O.:480] Out: 3225 [Urine:3225] Total I/O In: 240 [P.O.:240] Out: 600 [Urine:600]   Physical Exam: General- pt is awake,alert, oriented to time place and person Resp- No acute REsp distress, decreased Bs  at bases. CVS- S1S2 regular in rate and rhythm GIT- BS+, soft, NT, ND EXT- NO LE Edema, Cyanosis CNS- CN 2-12 grossly intact. Moving all 4 extremities Psych- normal mood and affect    Lab Results: CBC  Recent Labs  11/05/13 1909 11/06/13 0541  WBC 6.6 7.9  HGB 10.8* 10.7*  HCT 32.3* 31.8*  PLT 157 151    BMET  Recent Labs  11/06/13 0541 11/07/13 0607  NA 146 146  K 4.3 3.8  CL 112 110  CO2 26 26  GLUCOSE 102* 89  BUN 15 18  CREATININE 0.97 1.08  CALCIUM 8.4 8.5     Lab Results  Component Value Date   CALCIUM 8.5 11/07/2013    2d eco shows  Left ventricle: The cavity size was normal. Wall thickness was normal. Systolic function was normal. The estimated ejection fraction was in the range of 55% to 60%. Wall motion was normal; there  were no regional wall motion abnormalities. Doppler parameters are consistent with abnormal left ventricular relaxation (grade 1 diastolic dysfunction). - Left atrium: The atrium was mildly dilated. - Pulmonary arteries: Systolic pressure was mildly increased. PA peak pressure: 33 mm Hg (S).        Impression: 1)Renal CKD stage 2.               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 Present/Absent .               M i/a croalbuminuria Present/Absent.                Hematuria .                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 Secondary Hyperparathyroidism w/u pending  Phosphorus &Vitamin 25-OH will check.  5)Proetinuria- 24 hr being done Will add to labs   6)Electrolytes  Normokalemic NOrmonatremic   7)Acid base Co2 at goal   8) ID- Uti On levaquin  Plan:  Will ask for renal US Will ask for Spep, Upep, IFE Will ask for Ana, anti DS DNA Will ask for complement Will ask for anaca profile Will ask for CKD BMD labs Agree with Diuretics Agree with RAS blockers  Will  Quantify proteinuria  Will discuss need for biopsy after the results  Will need sleep study as outpt.    Tahmir Kleckner S 11/07/2013, 11:22 AM

## 2013-11-07 NOTE — Progress Notes (Signed)
Utilization review Completed Anan Dapolito RN BSN   

## 2013-11-08 LAB — PHOSPHORUS: PHOSPHORUS: 3.2 mg/dL (ref 2.3–4.6)

## 2013-11-08 LAB — BASIC METABOLIC PANEL
ANION GAP: 9 (ref 5–15)
BUN: 17 mg/dL (ref 6–23)
CHLORIDE: 108 meq/L (ref 96–112)
CO2: 27 meq/L (ref 19–32)
CREATININE: 1.04 mg/dL (ref 0.50–1.10)
Calcium: 8.7 mg/dL (ref 8.4–10.5)
GFR calc non Af Amer: 55 mL/min — ABNORMAL LOW (ref >90)
GFR, EST AFRICAN AMERICAN: 63 mL/min — AB (ref >90)
Glucose, Bld: 104 mg/dL — ABNORMAL HIGH (ref 70–99)
POTASSIUM: 3.8 meq/L (ref 3.7–5.3)
Sodium: 144 mEq/L (ref 137–147)

## 2013-11-08 LAB — ANTI-DNA ANTIBODY, DOUBLE-STRANDED: ds DNA Ab: 2 IU/mL

## 2013-11-08 LAB — VITAMIN D 25 HYDROXY (VIT D DEFICIENCY, FRACTURES): Vit D, 25-Hydroxy: 44 ng/mL (ref 30–89)

## 2013-11-08 LAB — ANA: Anti Nuclear Antibody(ANA): NEGATIVE

## 2013-11-08 LAB — MPO/PR-3 (ANCA) ANTIBODIES
Myeloperoxidase Abs: <1
Serine Protease 3: <1

## 2013-11-08 LAB — ANCA SCREEN W REFLEX TITER
Atypical p-ANCA Screen: NEGATIVE
c-ANCA Screen: NEGATIVE
p-ANCA Screen: NEGATIVE

## 2013-11-08 LAB — GLOMERULAR BASEMENT MEMBRANE ANTIBODIES: GBM Ab: <1

## 2013-11-08 MED ORDER — AMLODIPINE BESYLATE 5 MG PO TABS
5.0000 mg | ORAL_TABLET | Freq: Every day | ORAL | Status: DC
  Administered 2013-11-08 – 2013-11-09 (×2): 5 mg via ORAL
  Filled 2013-11-08 (×2): qty 1

## 2013-11-08 NOTE — Progress Notes (Signed)
[facility] PROGRESS NOTE  Caroline Terry KDT:[id number] DOB: Aug 05, 1945 DOA: 11/05/2013 PCP: No PCP Per Patient  Assessment/Plan: 1. Bilateral pleural effusions with anasarca. Appears to be related to hypoalbuminemia. Patient has been started on intravenous Lasix with good diuresis. She still has evidence of volume overload. Continue current treatments 2. Hypoalbuminemia with proteinuria. Possibly related to poor diet over the last several weeks. She does have proteinuria on urinalysis. 24-hour urine protein shows 1.7 g of protein per 24 hours. This is not nephrotic range. Further workup is pending. Appreciate nephrology assistance 3. Hypertension. Continue Coreg and lisinopril 4. Urinary tract infection. Continue the patient on Levaquin. Unfortunately urine culture was not sent. We'll treat empirically for a total of 5 days. 5. Hypothyroidism. Continue Synthroid  Code Status: full code Family Communication: no family present Disposition Plan: discharge home once improved   Consultants:  Nephrology  Procedures:    Antibiotics:  Levaquin 7/3  HPI/Subjective: Feeling better, feels edema is improving. Shortness of breath improving  Objective: Filed Vitals:   11/08/13 1943  BP: 127/60  Pulse: 63  Temp: 97.5 F (36.4 C)  Resp: 20    Intake/Output Summary (Last 24 hours) at 11/08/13 1957 Last data filed at 11/08/13 1700  Gross per 24 hour  Intake    903 ml  Output   3700 ml  Net  -2797 ml   Filed Weights   11/06/13 0018 11/07/13 0651 11/08/13 0642  Weight: 97.07 kg (214 lb) 96.163 kg (212 lb) 94.802 kg (209 lb)    Exam:   General:  NAD  Cardiovascular: S1, S2 RRR  Respiratory: diminished breat sounds at bases  Abdomen: soft, nt, nd, bs+  Musculoskeletal: 1+ edema b/l   Data Reviewed: Basic Metabolic Panel:  Recent Labs Lab 11/05/13 1909 11/06/13 0541 11/07/13 0607 11/08/13 0651  NA 143 146 146 144  K 3.9 4.3 3.8 3.8  CL 108 112 110 108   CO2 27 26 26 27   GLUCOSE 117* 102* 89 104*  BUN 16 15 18 17   CREATININE 0.96 0.97 1.08 1.04  CALCIUM 8.5 8.4 8.5 8.7  PHOS  --   --   --  3.2   Liver Function Tests:  Recent Labs Lab 11/05/13 1909 11/06/13 0541  AST 18 15  ALT 15 11  ALKPHOS 125* 120*  BILITOT 0.2* 0.3  PROT 6.0 5.7*  ALBUMIN 2.6* 2.5*   No results found for this basename: LIPASE, AMYLASE,  in the last 168 hours No results found for this basename: AMMONIA,  in the last 168 hours CBC:  Recent Labs Lab 11/05/13 1909 11/06/13 0541  WBC 6.6 7.9  NEUTROABS 4.3  --   HGB 10.8* 10.7*  HCT 32.3* 31.8*  MCV 91.8 91.6  PLT 157 151   Cardiac Enzymes: No results found for this basename: CKTOTAL, CKMB, CKMBINDEX, TROPONINI,  in the last 168 hours BNP (last 3 results)  Recent Labs  11/05/13 1909  PROBNP 2853.0*   CBG: No results found for this basename: GLUCAP,  in the last 168 hours  No results found for this or any previous visit (from the past 240 hour(s)).   Studies: US Renal  11/07/2013   CLINICAL DATA:  UTI, check renal size  EXAM: RENAL/URINARY TRACT ULTRASOUND COMPLETE  COMPARISON:  None.  FINDINGS: Right Kidney:  Length: 10.3 cm.  No mass or hydronephrosis.  Left Kidney:  Length: 12.2 cm.  No mass or hydronephrosis.  Bladder:  Within normal limits.  IMPRESSION: Negative renal ultrasound.  Electronically Signed   By: Julian Hy M.D.   On: 11/07/2013 12:42    Scheduled Meds: . amLODipine  5 mg Oral Daily  . antiseptic oral rinse  15 mL Mouth Rinse BID  . aspirin  81 mg Oral Daily  . carvedilol  3.125 mg Oral BID WC  . enoxaparin (LOVENOX) injection  40 mg Subcutaneous Q24H  . furosemide  20 mg Intravenous BID  . levofloxacin  500 mg Oral Daily  . levothyroxine  125 mcg Oral QAC breakfast  . lisinopril  5 mg Oral Daily  . sodium chloride  3 mL Intravenous Q12H  . sodium chloride  3 mL Intravenous Q12H   Continuous Infusions:   Active Problems:   Pleural effusion, bilateral   Leg  swelling    Time spent: 53mins    Caroline Terry  [facility] Pager [phone]. If 7PM-7AM, please contact night-coverage at [email or web address], password [facility] 11/08/2013, 7:57 PM  LOS: 3 days

## 2013-11-08 NOTE — Progress Notes (Signed)
Subjective: Interval History: has complaints of some difficulty in breathing. Patient denies any cough. Presently she denies also any nausea vomiting..  Objective: Vital signs in last 24 hours: Temp:  [97.4 F (36.3 C)-98.4 F (36.9 C)] 97.4 F (36.3 C) (07/06 0524) Pulse Rate:  [57-64] 62 (07/06 0524) Resp:  [20] 20 (07/06 0524) BP: (155-175)/(56-75) 175/75 mmHg (07/06 0524) SpO2:  [98 %-99 %] 98 % (07/06 0524) Weight:  [94.802 kg (209 lb)] 94.802 kg (209 lb) (07/06 0642) Weight change: -1.361 kg (-3 lb)  Intake/Output from previous day: 07/05 0701 - 07/06 0700 In: 840 [P.O.:840] Out: 3250 [Urine:3250] Intake/Output this shift:    General appearance: alert, cooperative and no distress Resp: diminished breath sounds posterior - bilateral Cardio: regular rate and rhythm, S1, S2 normal, no murmur, click, rub or gallop GI: soft, non-tender; bowel sounds normal; no masses,  no organomegaly Extremities: edema 1+ edema on the right and trace on the left  Lab Results:  Recent Labs  11/05/13 1909 11/06/13 0541  WBC 6.6 7.9  HGB 10.8* 10.7*  HCT 32.3* 31.8*  PLT 157 151   BMET:  Recent Labs  11/07/13 0607 11/08/13 0651  NA 146 144  K 3.8 3.8  CL 110 108  CO2 26 27  GLUCOSE 89 104*  BUN 18 17  CREATININE 1.08 1.04  CALCIUM 8.5 8.7   No results found for this basename: PTH,  in the last 72 hours Iron Studies: No results found for this basename: IRON, TIBC, TRANSFERRIN, FERRITIN,  in the last 72 hours  Studies/Results: US Renal  11/07/2013   CLINICAL DATA:  UTI, check renal size  EXAM: RENAL/URINARY TRACT ULTRASOUND COMPLETE  COMPARISON:  None.  FINDINGS: Right Kidney:  Length: 10.3 cm.  No mass or hydronephrosis.  Left Kidney:  Length: 12.2 cm.  No mass or hydronephrosis.  Bladder:  Within normal limits.  IMPRESSION: Negative renal ultrasound.   Electronically Signed   By: Julian Hy M.D.   On: 11/07/2013 12:42   US Venous Img Lower Bilateral  11/06/2013    CLINICAL DATA:  Bilateral leg swelling and pain.  EXAM: BILATERAL LOWER EXTREMITY VENOUS DOPPLER ULTRASOUND  TECHNIQUE: Gray-scale sonography with graded compression, as well as color Doppler and duplex ultrasound were performed to evaluate the lower extremity deep venous systems from the level of the common femoral vein and including the common femoral, femoral, profunda femoral, popliteal and calf veins including the posterior tibial, peroneal and gastrocnemius veins when visible. The superficial great saphenous vein was also interrogated. Spectral Doppler was utilized to evaluate flow at rest and with distal augmentation maneuvers in the common femoral, femoral and popliteal veins.  COMPARISON:  None.  FINDINGS: RIGHT LOWER EXTREMITY  Common Femoral Vein: No evidence of thrombus. Normal compressibility, respiratory phasicity and response to augmentation.  Saphenofemoral Junction: No evidence of thrombus. Normal compressibility and flow on color Doppler imaging.  Profunda Femoral Vein: No evidence of thrombus. Normal compressibility and flow on color Doppler imaging.  Femoral Vein: No evidence of thrombus. Normal compressibility, respiratory phasicity and response to augmentation.  Popliteal Vein: No evidence of thrombus. Normal compressibility, respiratory phasicity and response to augmentation.  Calf Veins: Not evaluated.  Superficial Great Saphenous Vein: No evidence of thrombus. Normal compressibility and flow on color Doppler imaging.  Venous Reflux:  None.  Other Findings:  None.  LEFT LOWER EXTREMITY  Common Femoral Vein: No evidence of thrombus. Normal compressibility, respiratory phasicity and response to augmentation.  Saphenofemoral Junction: No evidence of  thrombus. Normal compressibility and flow on color Doppler imaging.  Profunda Femoral Vein: No evidence of thrombus. Normal compressibility and flow on color Doppler imaging.  Femoral Vein: No evidence of thrombus. Normal compressibility, respiratory  phasicity and response to augmentation.  Popliteal Vein: No evidence of thrombus. Normal compressibility, respiratory phasicity and response to augmentation.  Calf Veins: Not evaluated.  Superficial Great Saphenous Vein: No evidence of thrombus. Normal compressibility and flow on color Doppler imaging.  Venous Reflux:  None.  Other Findings:  None.  IMPRESSION: No evidence of deep venous thrombosis.   Electronically Signed   By: Enrique Sack M.D.   On: 11/06/2013 10:48    I have reviewed the patient's current medications.  Assessment/Plan: Problem #1 proteinuria: Patient with 1.7, protein hence none nephrotic range. Presently workup for proteinuria is pending. Problem #2 hypertension: Her blood pressure is slightly high and not adequately controlled. Problem #3 leg swelling: Presently there is no DVT. Patient also does not seem to have significant proteinuria to count for her leg edema . Presently patient is on diuretics with good urine output. Problem #4 hypothyroidism: She is on Synthroid Problem #5 chronic renal failure: Stage II. Patient ultrasound showed right kidney to be 10.3 and left kidney 12.2. Presently there is no other finding. Since patient has hypertension and an equal kidney with rule out renal artery stenosis. Problem #6 anemia: Possibly iron deficiency however an anemia of chronic disease cannot ruled out. Problem #7 history of COPD Problem #8 history of urine tract infection: Patient presently on antibiotics. Plan: We'll continue his present management Will start patient on amlodipine 5 mg by mouth daily We'll check iron studies and basic metabolic panel in the morning.    LOS: 3 days   Xaiver Roskelley S 11/08/2013,8:38 AM

## 2013-11-09 LAB — BASIC METABOLIC PANEL
ANION GAP: 8 (ref 5–15)
BUN: 18 mg/dL (ref 6–23)
CO2: 28 meq/L (ref 19–32)
CREATININE: 1.06 mg/dL (ref 0.50–1.10)
Calcium: 8.3 mg/dL — ABNORMAL LOW (ref 8.4–10.5)
Chloride: 107 mEq/L (ref 96–112)
GFR calc non Af Amer: 53 mL/min — ABNORMAL LOW (ref >90)
GFR, EST AFRICAN AMERICAN: 62 mL/min — AB (ref >90)
Glucose, Bld: 97 mg/dL (ref 70–99)
Potassium: 3.8 mEq/L (ref 3.7–5.3)
SODIUM: 143 meq/L (ref 137–147)

## 2013-11-09 LAB — PROTEIN ELECTROPHORESIS, SERUM
Albumin ELP: 52.1 % — ABNORMAL LOW (ref 55.8–66.1)
Alpha-1-Globulin: 5.8 % — ABNORMAL HIGH (ref 2.9–4.9)
Alpha-2-Globulin: 12.4 % — ABNORMAL HIGH (ref 7.1–11.8)
Beta 2: 5.7 % (ref 3.2–6.5)
Beta Globulin: 6.2 % (ref 4.7–7.2)
Gamma Globulin: 17.8 % (ref 11.1–18.8)
M-Spike, %: NOT DETECTED g/dL
Total Protein ELP: 5 g/dL — ABNORMAL LOW (ref 6.0–8.3)

## 2013-11-09 LAB — IMMUNOFIXATION ELECTROPHORESIS
IgA: 194 mg/dL (ref 69–380)
IgG (Immunoglobin G), Serum: 983 mg/dL (ref 690–1700)
IgM, Serum: 52 mg/dL — ABNORMAL LOW (ref 52–322)
Total Protein ELP: 5.2 g/dL — ABNORMAL LOW (ref 6.0–8.3)

## 2013-11-09 LAB — PTH, INTACT AND CALCIUM
Calcium, Total (PTH): 8.2 mg/dL — ABNORMAL LOW (ref 8.4–10.5)
PTH: 60.3 pg/mL (ref 14.0–72.0)

## 2013-11-09 LAB — C4 COMPLEMENT: Complement C4, Body Fluid: 26 mg/dL (ref 10–40)

## 2013-11-09 LAB — C3 COMPLEMENT: C3 Complement: 144 mg/dL (ref 90–180)

## 2013-11-09 MED ORDER — FUROSEMIDE 40 MG PO TABS
40.0000 mg | ORAL_TABLET | Freq: Every day | ORAL | Status: DC
  Administered 2013-11-09: 40 mg via ORAL
  Filled 2013-11-09: qty 1

## 2013-11-09 MED ORDER — AMLODIPINE BESYLATE 5 MG PO TABS: 5.0000 mg | ORAL_TABLET | Freq: Every day | ORAL | Status: AC

## 2013-11-09 MED ORDER — POTASSIUM CHLORIDE ER 10 MEQ PO TBCR: 10.0000 meq | EXTENDED_RELEASE_TABLET | Freq: Every day | ORAL | Status: AC

## 2013-11-09 MED ORDER — FUROSEMIDE 40 MG PO TABS
40.0000 mg | ORAL_TABLET | Freq: Every day | ORAL | Status: AC
Start: 2013-11-09 — End: ?

## 2013-11-09 NOTE — Care Management Note (Signed)
    Page 1 of 1   11/09/2013     4:14:49 PM CARE MANAGEMENT NOTE 11/09/2013  Patient:  Caroline Terry, Caroline Terry   Account Number:  [id number]  Date Initiated:  11/09/2013  Documentation initiated by:  Vladimir Creeks  Subjective/Objective Assessment:   Admitted with pulmonary edema, and UTI. Pt lives with new spouse. They were just married a few weeks ago in [place], and are living in [place] in the winter and in [place], [place] in the summer.     Action/Plan:   Pt has been healthy until now per pt and spouse, so they hope she will get well quickly. She plans to see Dr Bishop Limbo MD, as PCP.  Pt is independent, except spouse does the driving. No needs identified   Anticipated DC Date:  11/09/2013   Anticipated DC Plan:  [place]  CM consult      Choice offered to / List presented to:             Status of service:  Completed, signed off Medicare Important Message given?  YES (If response is "NO", the following Medicare IM given date fields will be blank) Date Medicare IM given:  11/09/2013 Medicare IM given by:  Vladimir Creeks Date Additional Medicare IM given:   Additional Medicare IM given by:    Discharge Disposition:  HOME/SELF CARE  Per UR Regulation:  Reviewed for med. necessity/level of care/duration of stay  If discussed at Long Length of Stay Meetings, dates discussed:    Comments:  11/09/13 [place] RN/CM Pt will see Dr Lowanda Foster and Dr Cindie Laroche in the next 2 weeks, and does not feel she needs HH to visit. They ask for a copy of Heart healthy diet- RN notified and will give with D/C papers

## 2013-11-09 NOTE — Progress Notes (Signed)
Discharged home with instructions given on medications,and follow up visits,patient,and husband verbalized understanding.No c/o pain or discomfort noted.Prescriptions sent with patient.Accompanied by staff to an awaiting vehicle.

## 2013-11-09 NOTE — Progress Notes (Signed)
Subjective: Interval History: Patient feels much better and denies nay difficulty in breathing.  Patient's appetite is good and no cough today  Objective: Vital signs in last 24 hours: Temp:  [97.2 F (36.2 C)-97.6 F (36.4 C)] 97.2 F (36.2 C) (07/07 0432) Pulse Rate:  [55-72] 72 (07/07 0432) Resp:  [20] 20 (07/07 0432) BP: (127-150)/(60-76) 150/76 mmHg (07/07 0432) SpO2:  [97 %-100 %] 97 % (07/07 0432) Weight:  [92.534 kg (204 lb)] 92.534 kg (204 lb) (07/07 0618) Weight change: -2.268 kg (-5 lb)  Intake/Output from previous day: 07/06 0701 - 07/07 0700 In: 903 [P.O.:900; I.V.:3] Out: 4500 [Urine:4500] Intake/Output this shift:    General appearance: alert, cooperative and no distress Resp: diminished breath sounds posterior - bilateral Cardio: regular rate and rhythm, S1, S2 normal, no murmur, click, rub or gallop GI: soft, non-tender; bowel sounds normal; no masses,  no organomegaly Extremities: edema 1+ edema on the right and trace on the left  Lab Results: No results found for this basename: WBC, HGB, HCT, PLT,  in the last 72 hours BMET:   Recent Labs  11/08/13 0651 11/09/13 0555  NA 144 143  K 3.8 3.8  CL 108 107  CO2 27 28  GLUCOSE 104* 97  BUN 17 18  CREATININE 1.04 1.06  CALCIUM 8.7 8.3*   No results found for this basename: PTH,  in the last 72 hours Iron Studies: No results found for this basename: IRON, TIBC, TRANSFERRIN, FERRITIN,  in the last 72 hours  Studies/Results: US Renal  11/07/2013   CLINICAL DATA:  UTI, check renal size  EXAM: RENAL/URINARY TRACT ULTRASOUND COMPLETE  COMPARISON:  None.  FINDINGS: Right Kidney:  Length: 10.3 cm.  No mass or hydronephrosis.  Left Kidney:  Length: 12.2 cm.  No mass or hydronephrosis.  Bladder:  Within normal limits.  IMPRESSION: Negative renal ultrasound.   Electronically Signed   By: Julian Hy M.D.   On: 11/07/2013 12:42    I have reviewed the patient's current  medications.  Assessment/Plan: Problem #1 proteinuria: Patient with 1.7 gm  protein hence none nephrotic range. Work up for [employer] causes of proteinturia is negative till now with normal complement. 24-hour urine for protein and immunoelectrophoresis is pending. Problem #2 hypertension: Her blood pressure is  Better. Problem #3 leg swelling: Progressively improving. Presently patient had 4500 cc of urine output. She is on IV Lasix. Problem #4 hypothyroidism: She is on Synthroid Problem #5 chronic renal failure: Stage II. Patient ultrasound showed right kidney to be 10.3 and left kidney 12.2. Presently there is no other finding. Since patient has hypertension and an equal kidney with rule out renal artery stenosis. Problem #6 anemia: Possibly iron deficiency however an anemia of chronic disease cannot ruled out. Problem #7 history of COPD Problem #8 history of urine tract infection: Patient presently on antibiotics. Plan: We'll continue his present management We'll DC IV Lasix. Will start patient on Lasix 40 mg by mouth once a day.   werenWe'll check iron studies and basic metabolic panel in the morning.    LOS: 4 days   Sayed Apostol S 11/09/2013,7:28 AM

## 2013-11-09 NOTE — Discharge Summary (Signed)
Physician Discharge Summary  Laury Huizar IYM:[id number] DOB: [birth year] DOA: 11/05/2013  PCP: No PCP Per Patient, Patient plans to establish care with Dr. Cindie Laroche  Admit date: 11/05/2013 Discharge date: 11/09/2013  Time spent: 40 minutes  Recommendations for Outpatient Follow-up:  1. Patient will follow up with nephrology in 4 weeks 2. Follow up with primary care doctor in 2 weeks 3. Followup chest x-ray in 2-3 weeks to ensure resolution of the pleural effusions  Discharge Diagnoses:  Active Problems:   Pleural effusion, bilateral   Leg swelling Anasarca Hypoalbuminemia Proteinuria, non nephrotic range Acute respiratory failure UTI  Discharge Condition: improved  Diet recommendation: low salt  Filed Weights   11/07/13 0651 11/08/13 0642 11/09/13 0618  Weight: 96.163 kg (212 lb) 94.802 kg (209 lb) 92.534 kg (204 lb)    History of present illness:  68 year old female with history of hypertension, hypothyroidism who came to the ED complaining of leg swelling of the left leg since yesterday. Patient also complains of pain in the left leg. Patient recently moved [place] from [place] via a car in a road trip. Patient just got married on 16th of last month. Then yesterday patient developed leg swelling her husband got concerned and [facility] with a diagnosed with pneumonia and started on antibiotics. Patient denies shortness of breath has been coughing, denies chest pain.  In the ED patient was found to have elevated BNP 2853, d-dimer was elevated to 1.21, CT angiogram of the chest was done which was negative for pulmonary embolism. It showed interstitial edema with bilateral pleural effusions. Patient also has abnormal UA with 21-50 WBCs per high-power field.  Ultrasound of the lower extremities not available at this time, patient given empiric dose of Lovenox therapeutic anticoagulation.   Hospital Course:  This patient was admitted to the hospital  bilateral leg swelling as well as shortness of breath. She is to have bilateral pleural effusions and anasarca. Venous Dopplers of the lower extremities did not indicate any underlying DVT. The patient was started on intravenous Lasix. Echocardiogram was relatively unremarkable with grade 1 diastolic dysfunction. She did have an albumin of 2.5 and was felt that she likely had anasarca from hypoalbuminemia. She was also noted to have proteinuria. 24 hour urine protein did not reveal nephrotic range proteinuria. She was seen by nephrology and further workup for proteinuria was sent. At the time of discharge, labs are currently pending which can be followed up in the outpatient setting. Her fluid balance of -10 L since admission. She is feeling significantly improved and breathing comfortably on room air. She's to followup chest x-ray in 2-3 weeks to ensure resolution of pleural effusions. She has been transitioned to oral Lasix. She has been cleared for discharge by nephrology. She'll followup with nephrology in 3-4 weeks.  Procedures: Echo: - Normal LV function; grade 1 diastolic dysfunction; trace MR; mild TR.    Consultations:  Nephrology  Discharge Exam: Filed Vitals:   11/09/13 1412  BP: 144/57  Pulse: 62  Temp: 97.9 F (36.6 C)  Resp: 20    General: NAD Cardiovascular: s1, s2, rrr, 1+ edema Respiratory: cta b  Discharge Instructions You were cared for by a hospitalist during your hospital stay. If you have any questions about your discharge medications or the care you received while you were in the hospital after you are discharged, you can call the unit and asked to speak with the hospitalist on call if the hospitalist that took care of you  is not available. Once you are discharged, your primary care physician will handle any further medical issues. Please note that NO REFILLS for any discharge medications will be authorized once you are discharged, as it is imperative that you return  to your primary care physician (or establish a relationship with a primary care physician if you do not have one) for your aftercare needs so that they can reassess your need for medications and monitor your lab values.      Discharge Instructions   Call MD for:  difficulty breathing, headache or visual disturbances    Complete by:  As directed      Diet - low sodium heart healthy    Complete by:  As directed      Increase activity slowly    Complete by:  As directed             Medication List    STOP taking these medications       levofloxacin 500 MG tablet  Commonly known as:  LEVAQUIN      TAKE these medications       amLODipine 5 MG tablet  Commonly known as:  NORVASC  Take 1 tablet (5 mg total) by mouth daily.     aspirin 81 MG chewable tablet  Chew 81 mg by mouth daily.     carvedilol 3.125 MG tablet  Commonly known as:  COREG  Take 3.125 mg by mouth 2 (two) times daily with a meal.     furosemide 40 MG tablet  Commonly known as:  LASIX  Take 1 tablet (40 mg total) by mouth daily.     levothyroxine 125 MCG tablet  Commonly known as:  SYNTHROID, LEVOTHROID  Take 125 mcg by mouth daily before breakfast.     lisinopril 5 MG tablet  Commonly known as:  PRINIVIL,ZESTRIL  Take 5 mg by mouth daily.     potassium chloride 10 MEQ tablet  Commonly known as:  K-DUR  Take 1 tablet (10 mEq total) by mouth daily.     PROVENTIL HFA 108 (90 BASE) MCG/ACT inhaler  Generic drug:  albuterol  Inhale 2 puffs into the lungs every 4 (four) hours as needed for wheezing or shortness of breath.       No Known Allergies Follow-up Information   Follow up with [facility], MD. Schedule an appointment as soon as possible for a visit in 3 weeks.   Specialty:  Nephrology   Contact information:   [address] [place] [address] [phone]       Follow up with Maricela Curet, MD. Schedule an appointment as soon as possible for a visit in 2 weeks.    Specialty:  Internal Medicine   Contact information:   [place] [address] [phone]        The results of significant diagnostics from this hospitalization (including imaging, microbiology, ancillary and laboratory) are listed below for reference.    Significant Diagnostic Studies: Dg Chest 2 View  11/05/2013   CLINICAL DATA:  Newly diagnosed pneumonia.  EXAM: CHEST  2 VIEW  COMPARISON:  None.  FINDINGS: Cardiomegaly with small to moderate left pleural effusion. Associated left lower lobe opacity, likely atelectasis. No frank interstitial edema. No pneumothorax.  IMPRESSION: Cardiomegaly with small to moderate left pleural effusion.  Associated left lower lobe opacity, likely atelectasis.   Electronically Signed   By: Julian Hy M.D.   On: 11/05/2013 19:49   Ct Angio Chest Pe  W/cm &/or Wo Cm  11/05/2013   CLINICAL DATA:  Left leg swelling, newly diagnosed pneumonia, elevated D-dimer  EXAM: CT ANGIOGRAPHY CHEST WITH CONTRAST  TECHNIQUE: Multidetector CT imaging of the chest was performed using the standard protocol during bolus administration of intravenous contrast. Multiplanar CT image reconstructions and MIPs were obtained to evaluate the vascular anatomy.  CONTRAST:  146mL OMNIPAQUE IOHEXOL 350 MG/ML SOLN  COMPARISON:  Chest radiograph dated 11/05/2013  FINDINGS: No evidence of pulmonary embolism.  Evaluation of lung parenchyma is constrained by respiratory motion. Mild patchy ground-glass opacity in the lateral right upper lobe is favored to reflect mild interstitial edema (series 11/image 17). Additional mild interstitial edema with scattered interlobular septal thickening. Mild lower lobe compressive atelectasis. Moderate bilateral pleural effusions. No pneumothorax.  Cardiomegaly.  No pericardial effusion.  Visualized thyroid is mildly enlarged/heterogeneous but grossly unremarkable.  Mildly prominent mediastinal lymph nodes which are likely reactive.  Visualized  upper abdomen is grossly unremarkable.  Mild degenerative changes of the thoracic spine.  Review of the MIP images confirms the above findings.  IMPRESSION: No evidence of pulmonary embolism.  Cardiomegaly with mild interstitial edema and moderate bilateral pleural effusions.   Electronically Signed   By: Julian Hy M.D.   On: 11/05/2013 21:20   US Renal  11/07/2013   CLINICAL DATA:  UTI, check renal size  EXAM: RENAL/URINARY TRACT ULTRASOUND COMPLETE  COMPARISON:  None.  FINDINGS: Right Kidney:  Length: 10.3 cm.  No mass or hydronephrosis.  Left Kidney:  Length: 12.2 cm.  No mass or hydronephrosis.  Bladder:  Within normal limits.  IMPRESSION: Negative renal ultrasound.   Electronically Signed   By: Julian Hy M.D.   On: 11/07/2013 12:42   US Venous Img Lower Bilateral  11/06/2013   CLINICAL DATA:  Bilateral leg swelling and pain.  EXAM: BILATERAL LOWER EXTREMITY VENOUS DOPPLER ULTRASOUND  TECHNIQUE: Gray-scale sonography with graded compression, as well as color Doppler and duplex ultrasound were performed to evaluate the lower extremity deep venous systems from the level of the common femoral vein and including the common femoral, femoral, profunda femoral, popliteal and calf veins including the posterior tibial, peroneal and gastrocnemius veins when visible. The superficial great saphenous vein was also interrogated. Spectral Doppler was utilized to evaluate flow at rest and with distal augmentation maneuvers in the common femoral, femoral and popliteal veins.  COMPARISON:  None.  FINDINGS: RIGHT LOWER EXTREMITY  Common Femoral Vein: No evidence of thrombus. Normal compressibility, respiratory phasicity and response to augmentation.  Saphenofemoral Junction: No evidence of thrombus. Normal compressibility and flow on color Doppler imaging.  Profunda Femoral Vein: No evidence of thrombus. Normal compressibility and flow on color Doppler imaging.  Femoral Vein: No evidence of thrombus. Normal  compressibility, respiratory phasicity and response to augmentation.  Popliteal Vein: No evidence of thrombus. Normal compressibility, respiratory phasicity and response to augmentation.  Calf Veins: Not evaluated.  Superficial Great Saphenous Vein: No evidence of thrombus. Normal compressibility and flow on color Doppler imaging.  Venous Reflux:  None.  Other Findings:  None.  LEFT LOWER EXTREMITY  Common Femoral Vein: No evidence of thrombus. Normal compressibility, respiratory phasicity and response to augmentation.  Saphenofemoral Junction: No evidence of thrombus. Normal compressibility and flow on color Doppler imaging.  Profunda Femoral Vein: No evidence of thrombus. Normal compressibility and flow on color Doppler imaging.  Femoral Vein: No evidence of thrombus. Normal compressibility, respiratory phasicity and response to augmentation.  Popliteal Vein: No evidence of thrombus. Normal compressibility,  respiratory phasicity and response to augmentation.  Calf Veins: Not evaluated.  Superficial Great Saphenous Vein: No evidence of thrombus. Normal compressibility and flow on color Doppler imaging.  Venous Reflux:  None.  Other Findings:  None.  IMPRESSION: No evidence of deep venous thrombosis.   Electronically Signed   By: Enrique Sack M.D.   On: 11/06/2013 10:48    Microbiology: No results found for this or any previous visit (from the past 240 hour(s)).   Labs: Basic Metabolic Panel:  Recent Labs Lab 11/05/13 1909 11/06/13 0541 11/07/13 0607 11/08/13 0500 11/08/13 0651 11/09/13 0555  NA 143 146 146  --  144 143  K 3.9 4.3 3.8  --  3.8 3.8  CL 108 112 110  --  108 107  CO2 27 26 26   --  27 28  GLUCOSE 117* 102* 89  --  104* 97  BUN 16 15 18   --  17 18  CREATININE 0.96 0.97 1.08  --  1.04 1.06  CALCIUM 8.5 8.4 8.5 8.2* 8.7 8.3*  PHOS  --   --   --   --  3.2  --    Liver Function Tests:  Recent Labs Lab 11/05/13 1909 11/06/13 0541  AST 18 15  ALT 15 11  ALKPHOS 125* 120*   BILITOT 0.2* 0.3  PROT 6.0 5.7*  ALBUMIN 2.6* 2.5*   No results found for this basename: LIPASE, AMYLASE,  in the last 168 hours No results found for this basename: AMMONIA,  in the last 168 hours CBC:  Recent Labs Lab 11/05/13 1909 11/06/13 0541  WBC 6.6 7.9  NEUTROABS 4.3  --   HGB 10.8* 10.7*  HCT 32.3* 31.8*  MCV 91.8 91.6  PLT 157 151   Cardiac Enzymes: No results found for this basename: CKTOTAL, CKMB, CKMBINDEX, TROPONINI,  in the last 168 hours BNP: BNP (last 3 results)  Recent Labs  11/05/13 1909  PROBNP 2853.0*   CBG: No results found for this basename: GLUCAP,  in the last 168 hours     Signed:  MEMON,JEHANZEB  [facility] 11/09/2013, 3:18 PM

## 2013-11-10 LAB — UIFE/LIGHT CHAINS/TP QN, 24-HR UR
Albumin, U: DETECTED
Alpha 1, Urine: DETECTED — AB
Alpha 2, Urine: DETECTED — AB
Beta, Urine: DETECTED — AB
Free Kappa Lt Chains,Ur: 1.98 mg/dL (ref 0.14–2.42)
Free Kappa/Lambda Ratio: 1.56 ratio — ABNORMAL LOW (ref 2.04–10.37)
Free Lambda Excretion/Day: 33.02 mg/d
Free Lambda Lt Chains,Ur: 1.27 mg/dL — ABNORMAL HIGH (ref 0.02–0.67)
Free Lt Chn Excr Rate: 51.48 mg/d
Gamma Globulin, Urine: DETECTED — AB
Time: 24 hours
Total Protein, Urine-Ur/day: 1300 mg/d — ABNORMAL HIGH (ref 10–140)
Total Protein, Urine: 50 mg/dL
Volume, Urine: 2600 mL

## 2013-11-10 LAB — COMPLEMENT, TOTAL: Compl, Total (CH50): >60 U/mL — ABNORMAL HIGH (ref 31–60)

## 2013-11-10 NOTE — Progress Notes (Signed)
UR chart review completed.  

## 2013-11-26 ENCOUNTER — Other Ambulatory Visit (HOSPITAL_COMMUNITY): Payer: Self-pay | Admitting: Family Medicine

## 2013-11-26 DIAGNOSIS — Z1231 Encounter for screening mammogram for malignant neoplasm of breast: Secondary | ICD-10-CM

## 2013-12-01 ENCOUNTER — Ambulatory Visit (HOSPITAL_COMMUNITY)
Admission: RE | Admit: 2013-12-01 | Discharge: 2013-12-01 | Disposition: A | Discharge status: Home / Self Care | Payer: Medicare Other | Source: Ambulatory Visit | Attending: Family Medicine | Admitting: Family Medicine

## 2013-12-01 DIAGNOSIS — Z1231 Encounter for screening mammogram for malignant neoplasm of breast: Secondary | ICD-10-CM

## 2016-01-22 ENCOUNTER — Emergency Department (HOSPITAL_COMMUNITY)
Admission: EM | Admit: 2016-01-22 | Discharge: 2016-01-22 | Disposition: A | Discharge status: Home / Self Care | Payer: Medicare Other | Attending: Emergency Medicine | Admitting: Emergency Medicine

## 2016-01-22 ENCOUNTER — Encounter (HOSPITAL_COMMUNITY)
Admission: RE | Admit: 2016-01-22 | Discharge: 2016-01-22 | Disposition: A | Discharge status: Home / Self Care | Payer: Medicare Other | Source: Ambulatory Visit | Attending: Family Medicine | Admitting: Family Medicine

## 2016-01-22 ENCOUNTER — Encounter (HOSPITAL_COMMUNITY): Payer: Self-pay | Admitting: Emergency Medicine

## 2016-01-22 ENCOUNTER — Emergency Department (HOSPITAL_COMMUNITY): Payer: Medicare Other

## 2016-01-22 DIAGNOSIS — W0110XA Fall on same level from slipping, tripping and stumbling with subsequent striking against unspecified object, initial encounter: Secondary | ICD-10-CM | POA: Diagnosis not present

## 2016-01-22 DIAGNOSIS — Y939 Activity, unspecified: Secondary | ICD-10-CM | POA: Diagnosis not present

## 2016-01-22 DIAGNOSIS — S20211A Contusion of right front wall of thorax, initial encounter: Secondary | ICD-10-CM | POA: Diagnosis not present

## 2016-01-22 DIAGNOSIS — Z9689 Presence of other specified functional implants: Secondary | ICD-10-CM | POA: Diagnosis present

## 2016-01-22 DIAGNOSIS — Y999 Unspecified external cause status: Secondary | ICD-10-CM | POA: Diagnosis not present

## 2016-01-22 DIAGNOSIS — I10 Essential (primary) hypertension: Secondary | ICD-10-CM | POA: Insufficient documentation

## 2016-01-22 DIAGNOSIS — Y929 Unspecified place or not applicable: Secondary | ICD-10-CM | POA: Insufficient documentation

## 2016-01-22 DIAGNOSIS — Z79899 Other long term (current) drug therapy: Secondary | ICD-10-CM | POA: Diagnosis not present

## 2016-01-22 DIAGNOSIS — S299XXA Unspecified injury of thorax, initial encounter: Secondary | ICD-10-CM | POA: Diagnosis present

## 2016-01-22 HISTORY — DX: Malignant (primary) neoplasm, unspecified: C80.1

## 2016-01-22 HISTORY — DX: Essential (primary) hypertension: I10

## 2016-01-22 MED ORDER — TRAMADOL HCL 50 MG PO TABS: 50.0000 mg | ORAL_TABLET | Freq: Four times a day (QID) | ORAL | 0 refills | Status: AC | PRN

## 2016-01-22 MED ORDER — SODIUM CHLORIDE 0.9% FLUSH
10.0000 mL | INTRAVENOUS | Status: AC | PRN
  Administered 2016-01-22: 10 mL

## 2016-01-22 MED ORDER — HEPARIN SOD (PORK) LOCK FLUSH 100 UNIT/ML IV SOLN
INTRAVENOUS | Status: AC
  Filled 2016-01-22: qty 5

## 2016-01-22 MED ORDER — HEPARIN SOD (PORK) LOCK FLUSH 100 UNIT/ML IV SOLN
500.0000 [IU] | INTRAVENOUS | Status: AC | PRN
  Administered 2016-01-22: 500 [IU]

## 2016-01-22 NOTE — ED Provider Notes (Signed)
[facility] DEPT Provider Note   CSN: [id number] Arrival date & time: 01/22/16  1030  By signing my name below, I, Caroline Terry, attest that this documentation has been prepared under the direction and in the presence of Julianne Rice, MD. Electronically Signed: Rayna Terry, ED Scribe. 01/22/16. 11:24 AM.   History   Chief Complaint Chief Complaint  Patient presents with  . Fall    HPI HPI Comments: Caroline Terry is a 70 y.o. female who presents to the Emergency Department complaining of a fall that occurred 3 days ago. Her husband states she fell and caught herself with her right arm extended on the couch causing her right elbow to strike her right ribs. They present today for imaging of her right ribs since they are about to go on a road trip to [place]. Her pain worsens with deep breathing and she denies SOB. She is currently taking Lovenox due to a h/o of blood clots and was taken off Xarelto due to her h/o CA. She denies cough or other associated symptoms at this time.   The history is provided by the patient and the spouse. No language interpreter was used.    Past Medical History:  Diagnosis Date  . Cancer ([place])    ovarian  . Hypertension     There are no active problems to display for this patient.   Past Surgical History:  Procedure Laterality Date  . ABDOMINAL HYSTERECTOMY    . APPENDECTOMY    . BREAST REDUCTION SURGERY    . TONSILLECTOMY      OB History    No data available       Home Medications    Prior to Admission medications   Medication Sig Start Date End Date Taking? Authorizing Provider  traMADol (ULTRAM) 50 MG tablet Take 1 tablet (50 mg total) by mouth every 6 (six) hours as needed. 01/22/16   Julianne Rice, MD    Family History No family history on file.  Social History Social History  Substance Use Topics  . Smoking status: Never Smoker  . Smokeless tobacco: Never Used  . Alcohol use No     Allergies   Review of  patient's allergies indicates no known allergies.   Review of Systems Review of Systems  Constitutional: Negative for chills and fever.  Respiratory: Negative for shortness of breath.   Cardiovascular: Positive for chest pain. Negative for palpitations and leg swelling.  Gastrointestinal: Negative for abdominal pain, diarrhea, nausea and vomiting.  Musculoskeletal: Negative for back pain and neck pain.  Skin: Negative for rash.  Neurological: Negative for dizziness, weakness, light-headedness, numbness and headaches.  All other systems reviewed and are negative.    Physical Exam Updated Vital Signs BP 124/55 (BP Location: Left Arm)   Pulse 68   Temp 97.3 F (36.3 C) (Temporal)   Resp 16   Ht 6' (1.829 m)   Wt 200 lb (90.7 kg)   SpO2 100%   BMI 27.12 kg/m   Physical Exam  Constitutional: She is oriented to person, place, and time. She appears well-developed and well-nourished.  HENT:  Head: Normocephalic and atraumatic.  Mouth/Throat: Oropharynx is clear and moist.  Eyes: EOM are normal. Pupils are equal, round, and reactive to light.  Neck: Normal range of motion. Neck supple.  Cardiovascular: Normal rate and regular rhythm.  Exam reveals no gallop and no friction rub.   No murmur heard. Pulmonary/Chest: Effort normal and breath sounds normal. No respiratory distress. She has no wheezes.  She has no rales. She exhibits tenderness (patient's chest wall tenderness is c reproduced with palpation over the inferior right sided ribs. No obvious contusion or deformity.).  Abdominal: Soft. Bowel sounds are normal. There is no tenderness. There is no rebound and no guarding.  Musculoskeletal: Normal range of motion. She exhibits no edema or tenderness.  No lower extremity swelling, asymmetry or tenderness. Distal pulses are intact.  Neurological: She is alert and oriented to person, place, and time.  Moves all extremities without deficit. Sensation is fully intact. Ambulating without  assistance.  Skin: Skin is warm and dry. Capillary refill takes less than 2 seconds. No rash noted. No erythema.  Psychiatric: She has a normal mood and affect. Her behavior is normal.  Nursing note and vitals reviewed.    ED Treatments / Results  Labs (all labs ordered are listed, but only abnormal results are displayed) Labs Reviewed - No data to display  EKG  EKG Interpretation None       Radiology Dg Ribs Unilateral W/chest Right  Result Date: 01/22/2016 CLINICAL DATA:  Right chest pain.  Fall on Friday. EXAM: RIGHT RIBS AND CHEST - 3+ VIEW COMPARISON:  None. FINDINGS: Right Port-A-Cath is in place with the tip at the cavoatrial junction. Heart is normal size. Lungs are clear. No visible rib fracture, pleural effusion or pneumothorax. IMPRESSION: No acute findings. Electronically Signed   By: Rolm Baptise M.D.   On: 01/22/2016 11:14    Procedures Procedures  DIAGNOSTIC STUDIES: Oxygen Saturation is 100% on RA, normal by my interpretation.    COORDINATION OF CARE: 11:24 AM Discussed next steps with pt. Pt verbalized understanding and is agreeable with the plan.    Medications Ordered in ED Medications - No data to display   Initial Impression / Assessment and Plan / ED Course  I have reviewed the triage vital signs and the nursing notes.  Pertinent labs & imaging results that were available during my care of the patient were reviewed by me and considered in my medical decision making (see chart for details).  Clinical Course   X-ray without acute findings. Tenderness is reproduced with palpation. Likely contused ribs. We'll do a bedside fast to assess for free fluid. Bedside fast without evidence of free fluid. Patient continues to be well-appearing. Vital signs are stable. We'll discharge home to follow-up with her primary physician. Return precautions given. I personally performed the services described in this documentation, which was scribed in my presence. The  recorded information has been reviewed and is accurate.    Final Clinical Impressions(s) / ED Diagnoses   Final diagnoses:  Rib contusion, right, initial encounter    New Prescriptions New Prescriptions   TRAMADOL (ULTRAM) 50 MG TABLET    Take 1 tablet (50 mg total) by mouth every 6 (six) hours as needed.     Julianne Rice, MD 01/22/16 1253

## 2016-01-22 NOTE — Progress Notes (Signed)
Implanted port to right chest accessed using sterile tech. Blood return present. Port flushed with saline then heparin. Tolerated well. Port deaccessed and gauze drsg to site. Tolerated well.

## 2016-01-22 NOTE — ED Triage Notes (Signed)
Pt reports tripping and falling on Friday. Pt was able to catch herself on the sofa but elbowed her RT ribcage. Pt reports pain with deep inspiration. Denies any SOB.

## 2016-01-22 NOTE — Progress Notes (Signed)
Pt and husband here to have pt's porta cath flushed. Husband states that they are leaving to go to [place] and he wants her port flushed before they leave on Friday. Pt sees Dr Cindie Laroche. I called Dr Denita [facility] office and a verbal order was given to flush pt's port.

## 2016-01-23 ENCOUNTER — Encounter (HOSPITAL_COMMUNITY): Payer: Self-pay | Admitting: Emergency Medicine

## 2018-05-08 IMAGING — DX DG RIBS W/ CHEST 3+V*R*
4 series · 4 of 4 positions shown · non-contrast
Comparison: None.

CLINICAL DATA: Right chest pain.  Fall on [REDACTED].

EXAM:
RIGHT RIBS AND CHEST - 3+ VIEW

[chest pa]
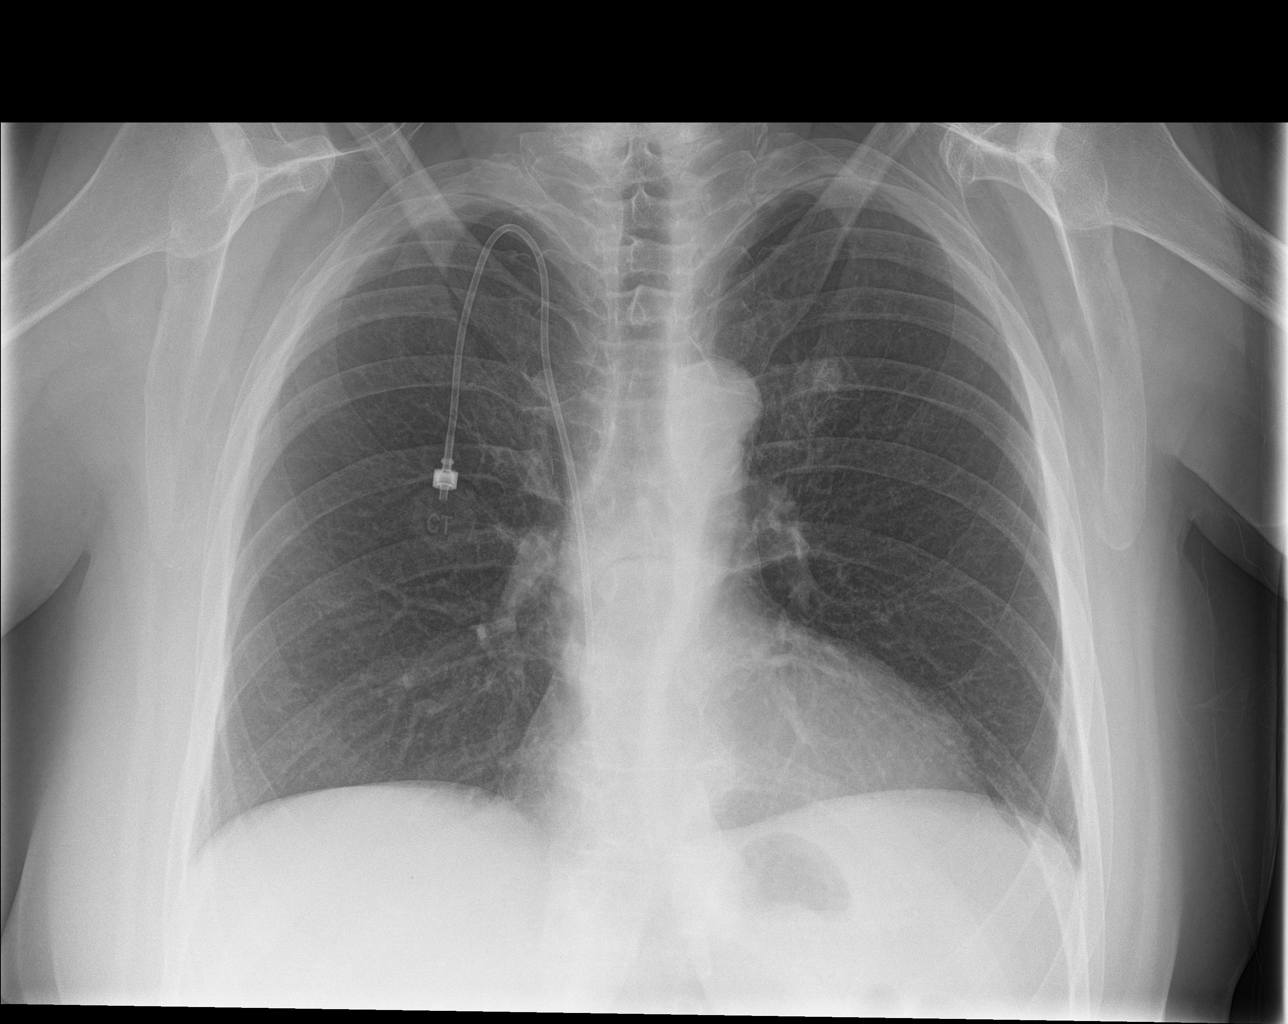

[rib pa]
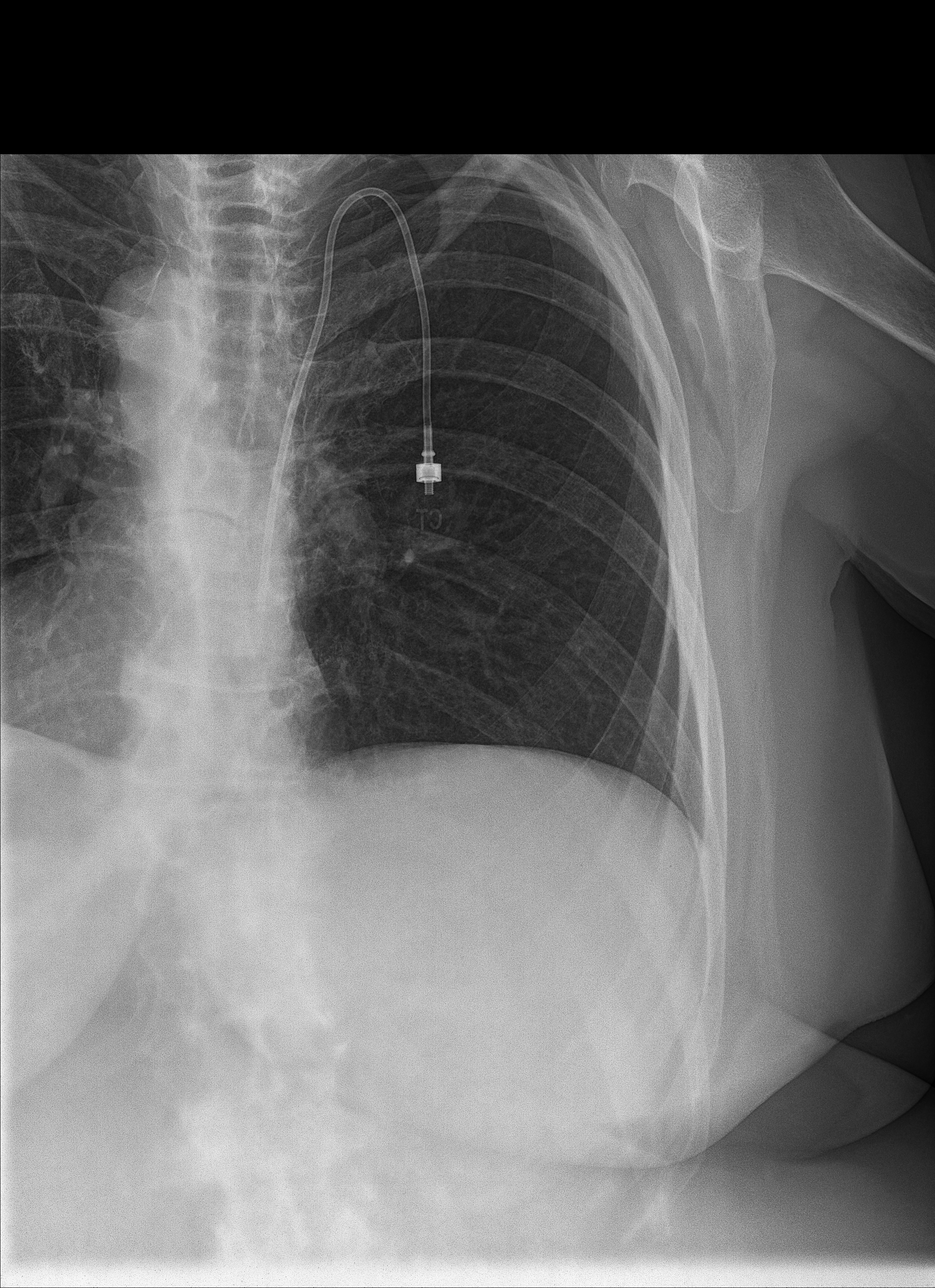

[rib obl (1 of 2)]
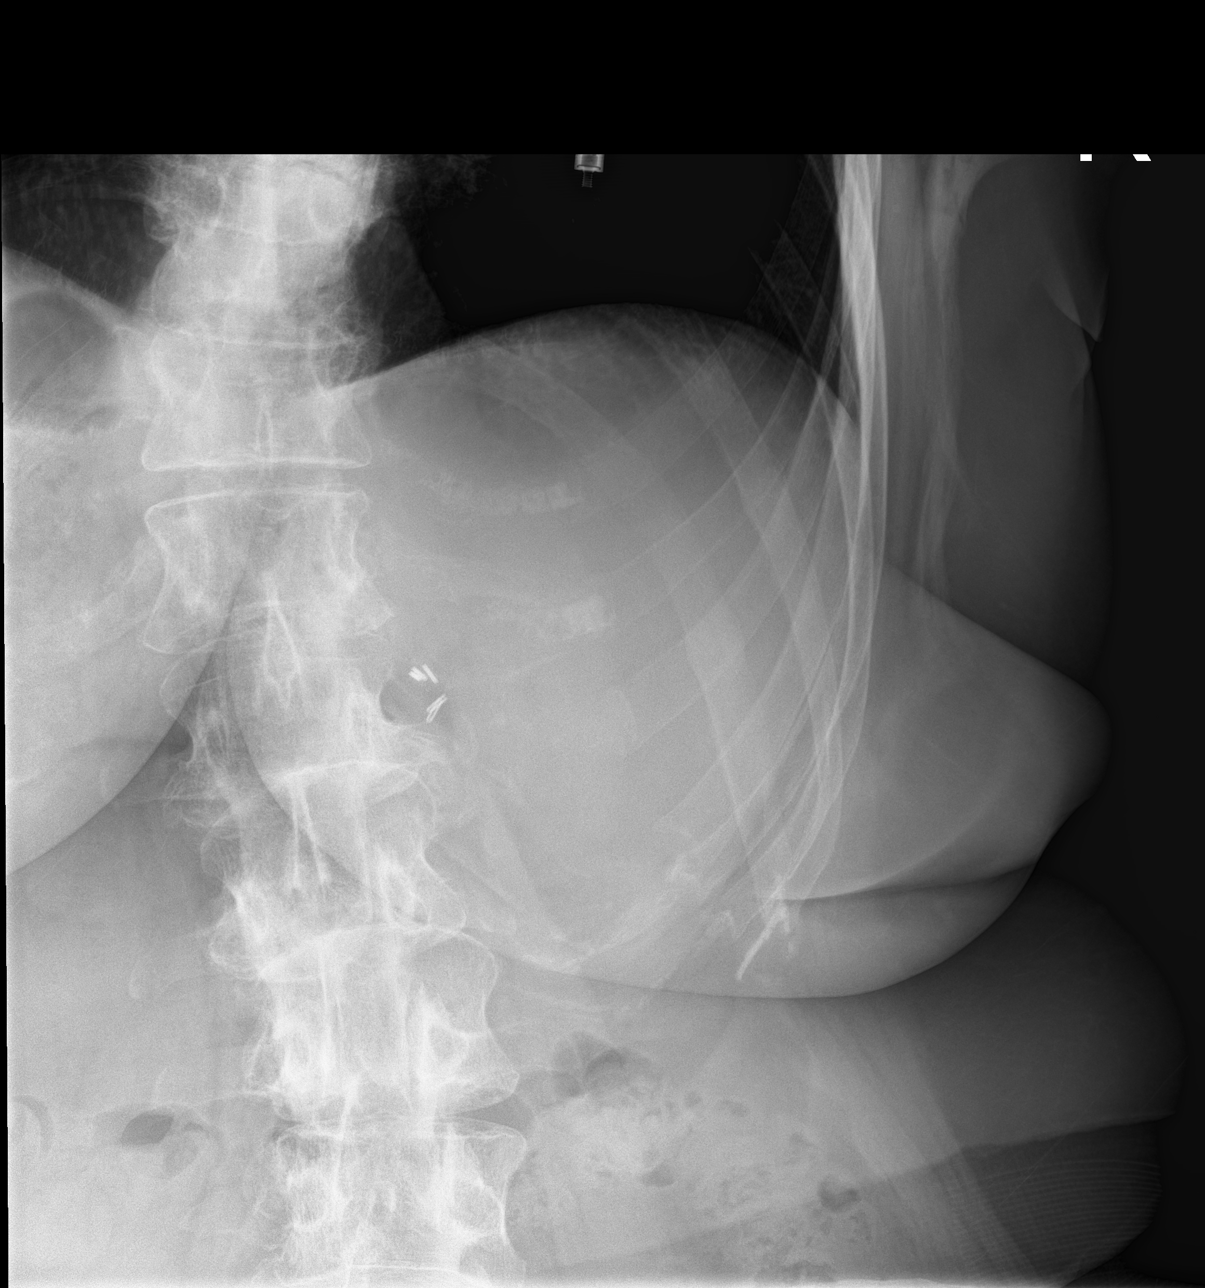

[rib obl (2 of 2)]
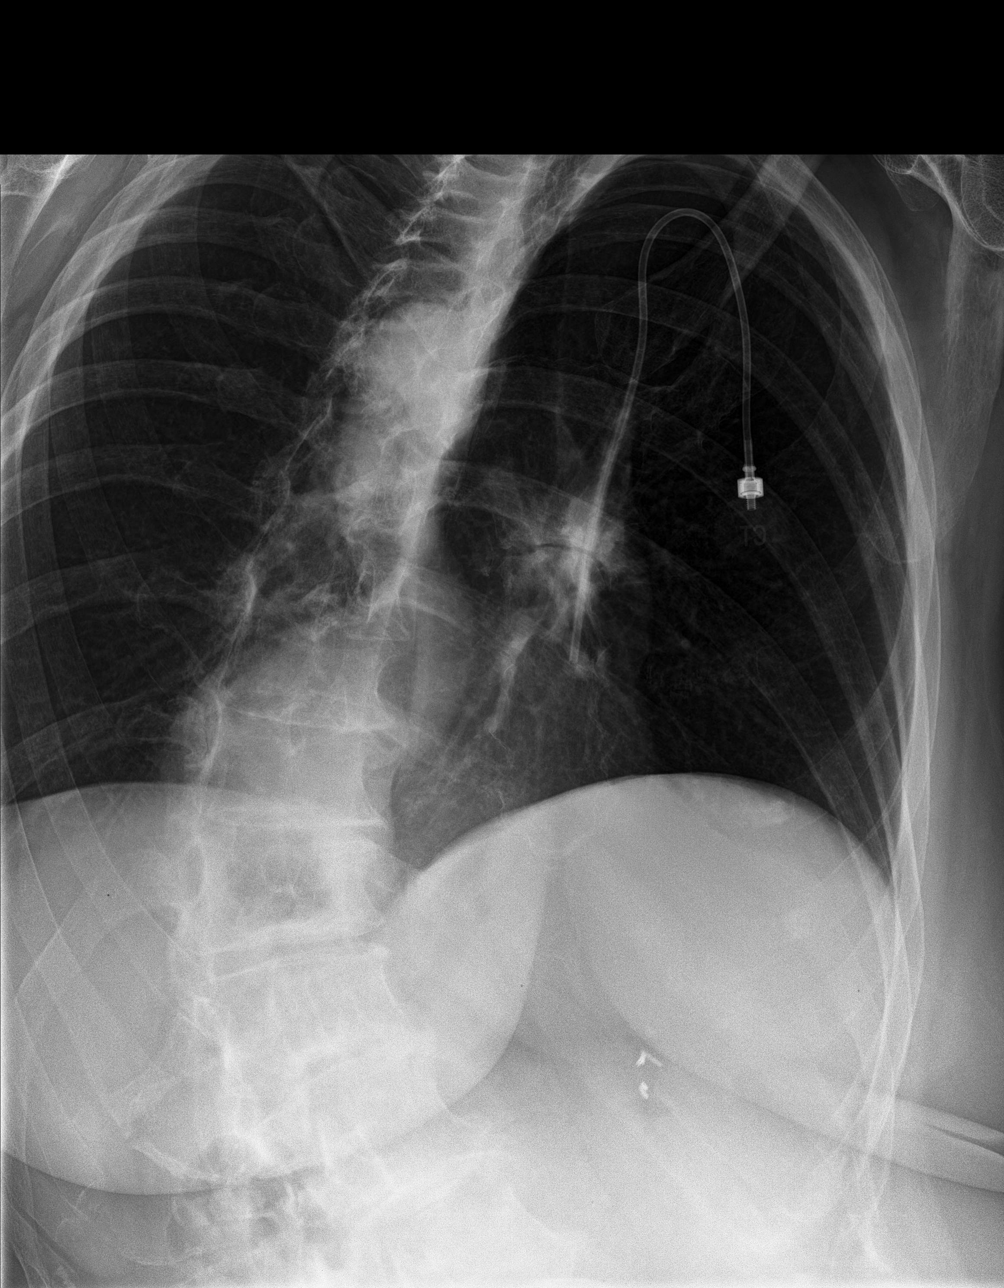

[4 of 4 positions shown; findings below may reference images not displayed]

FINDINGS: Right Port-A-Cath is in place with the tip at the cavoatrial
junction. Heart is normal size. Lungs are clear. No visible rib
fracture, pleural effusion or pneumothorax.
IMPRESSION: No acute findings.
